# Patient Record
Sex: Male | Born: 1940 | Race: White | Hispanic: No | Smoking: Never smoker
Health system: Southern US, Community
[De-identification: ages and names within clinical notes are randomized; demographics above are authoritative.]

## PROBLEM LIST (undated history)

## (undated) DIAGNOSIS — K219 Gastro-esophageal reflux disease without esophagitis: Secondary | ICD-10-CM

## (undated) DIAGNOSIS — G8929 Other chronic pain: Secondary | ICD-10-CM

## (undated) DIAGNOSIS — M5412 Radiculopathy, cervical region: Secondary | ICD-10-CM

## (undated) DIAGNOSIS — M1990 Unspecified osteoarthritis, unspecified site: Secondary | ICD-10-CM

## (undated) DIAGNOSIS — Z951 Presence of aortocoronary bypass graft: Secondary | ICD-10-CM

## (undated) DIAGNOSIS — Z85038 Personal history of other malignant neoplasm of large intestine: Secondary | ICD-10-CM

## (undated) DIAGNOSIS — Z86718 Personal history of other venous thrombosis and embolism: Secondary | ICD-10-CM

## (undated) DIAGNOSIS — M542 Cervicalgia: Secondary | ICD-10-CM

## (undated) DIAGNOSIS — G5600 Carpal tunnel syndrome, unspecified upper limb: Secondary | ICD-10-CM

## (undated) DIAGNOSIS — F32A Depression, unspecified: Secondary | ICD-10-CM

## (undated) DIAGNOSIS — M545 Low back pain, unspecified: Secondary | ICD-10-CM

## (undated) DIAGNOSIS — M549 Dorsalgia, unspecified: Secondary | ICD-10-CM

## (undated) DIAGNOSIS — Z8505 Personal history of malignant neoplasm of liver: Secondary | ICD-10-CM

## (undated) DIAGNOSIS — M67919 Unspecified disorder of synovium and tendon, unspecified shoulder: Secondary | ICD-10-CM

## (undated) DIAGNOSIS — Z8679 Personal history of other diseases of the circulatory system: Secondary | ICD-10-CM

## (undated) DIAGNOSIS — E119 Type 2 diabetes mellitus without complications: Secondary | ICD-10-CM

## (undated) DIAGNOSIS — E785 Hyperlipidemia, unspecified: Secondary | ICD-10-CM

## (undated) DIAGNOSIS — F329 Major depressive disorder, single episode, unspecified: Secondary | ICD-10-CM

## (undated) DIAGNOSIS — I2510 Atherosclerotic heart disease of native coronary artery without angina pectoris: Secondary | ICD-10-CM

## (undated) DIAGNOSIS — Z8589 Personal history of malignant neoplasm of other organs and systems: Secondary | ICD-10-CM

## (undated) DIAGNOSIS — C7800 Secondary malignant neoplasm of unspecified lung: Secondary | ICD-10-CM

## (undated) DIAGNOSIS — I10 Essential (primary) hypertension: Secondary | ICD-10-CM

## (undated) HISTORY — PX: CORONARY ARTERY BYPASS GRAFT: SHX141

## (undated) HISTORY — PX: PORTACATH PLACEMENT: SHX2246

## (undated) HISTORY — PX: CARDIAC CATHETERIZATION: SHX172

## (undated) HISTORY — PX: CARDIOVASCULAR STRESS TEST: SHX262

## (undated) HISTORY — PX: OTHER SURGICAL HISTORY: SHX169

## (undated) HISTORY — PX: CATARACT EXTRACTION W/ INTRAOCULAR LENS  IMPLANT, BILATERAL: SHX1307

---

## 1997-08-20 ENCOUNTER — Encounter: Admission: RE | Admit: 1997-08-20 | Discharge: 1997-11-18 | Payer: Self-pay | Admitting: Family Medicine

## 1998-08-06 ENCOUNTER — Encounter: Payer: Self-pay | Admitting: Interventional Cardiology

## 1998-08-07 ENCOUNTER — Inpatient Hospital Stay (HOSPITAL_COMMUNITY): Admission: AD | Admit: 1998-08-07 | Discharge: 1998-08-13 | Payer: Self-pay | Admitting: Interventional Cardiology

## 1998-08-08 ENCOUNTER — Encounter: Payer: Self-pay | Admitting: Cardiothoracic Surgery

## 1998-08-09 ENCOUNTER — Encounter: Payer: Self-pay | Admitting: Cardiothoracic Surgery

## 1998-08-10 ENCOUNTER — Encounter: Payer: Self-pay | Admitting: Cardiothoracic Surgery

## 1998-08-12 ENCOUNTER — Encounter: Payer: Self-pay | Admitting: Cardiothoracic Surgery

## 1998-09-25 ENCOUNTER — Encounter: Admission: RE | Admit: 1998-09-25 | Discharge: 1998-12-24 | Payer: Self-pay | Admitting: Family Medicine

## 1999-01-18 ENCOUNTER — Encounter: Payer: Self-pay | Admitting: Emergency Medicine

## 1999-01-18 ENCOUNTER — Emergency Department (HOSPITAL_COMMUNITY): Admission: EM | Admit: 1999-01-18 | Discharge: 1999-01-18 | Payer: Self-pay | Admitting: *Deleted

## 1999-01-25 ENCOUNTER — Emergency Department (HOSPITAL_COMMUNITY): Admission: EM | Admit: 1999-01-25 | Discharge: 1999-01-25 | Payer: Self-pay | Admitting: Emergency Medicine

## 1999-02-03 ENCOUNTER — Encounter (INDEPENDENT_AMBULATORY_CARE_PROVIDER_SITE_OTHER): Payer: Self-pay | Admitting: *Deleted

## 1999-02-03 ENCOUNTER — Ambulatory Visit (HOSPITAL_COMMUNITY): Admission: RE | Admit: 1999-02-03 | Discharge: 1999-02-03 | Payer: Self-pay | Admitting: Gastroenterology

## 2002-07-31 ENCOUNTER — Encounter: Admission: RE | Admit: 2002-07-31 | Discharge: 2002-07-31 | Payer: Self-pay | Admitting: Family Medicine

## 2002-07-31 ENCOUNTER — Encounter: Payer: Self-pay | Admitting: Family Medicine

## 2003-07-06 ENCOUNTER — Encounter: Admission: RE | Admit: 2003-07-06 | Discharge: 2003-07-06 | Payer: Self-pay | Admitting: Family Medicine

## 2003-07-12 ENCOUNTER — Encounter: Admission: RE | Admit: 2003-07-12 | Discharge: 2003-07-12 | Payer: Self-pay | Admitting: Family Medicine

## 2004-03-16 HISTORY — PX: OTHER SURGICAL HISTORY: SHX169

## 2004-09-12 ENCOUNTER — Emergency Department (HOSPITAL_COMMUNITY): Admission: EM | Admit: 2004-09-12 | Discharge: 2004-09-12 | Payer: Self-pay | Admitting: Emergency Medicine

## 2004-09-19 ENCOUNTER — Inpatient Hospital Stay (HOSPITAL_COMMUNITY): Admission: EM | Admit: 2004-09-19 | Discharge: 2004-09-20 | Payer: Self-pay | Admitting: Emergency Medicine

## 2004-10-07 ENCOUNTER — Encounter (INDEPENDENT_AMBULATORY_CARE_PROVIDER_SITE_OTHER): Payer: Self-pay | Admitting: *Deleted

## 2004-10-07 ENCOUNTER — Ambulatory Visit (HOSPITAL_COMMUNITY): Admission: RE | Admit: 2004-10-07 | Discharge: 2004-10-07 | Payer: Self-pay | Admitting: Gastroenterology

## 2004-11-03 ENCOUNTER — Encounter (INDEPENDENT_AMBULATORY_CARE_PROVIDER_SITE_OTHER): Payer: Self-pay | Admitting: *Deleted

## 2004-11-03 ENCOUNTER — Inpatient Hospital Stay (HOSPITAL_COMMUNITY): Admission: RE | Admit: 2004-11-03 | Discharge: 2004-11-09 | Payer: Self-pay | Admitting: Surgery

## 2004-11-06 ENCOUNTER — Ambulatory Visit: Payer: Self-pay | Admitting: Hematology & Oncology

## 2004-11-12 ENCOUNTER — Ambulatory Visit: Payer: Self-pay | Admitting: Hematology & Oncology

## 2004-11-21 ENCOUNTER — Ambulatory Visit (HOSPITAL_COMMUNITY): Admission: RE | Admit: 2004-11-21 | Discharge: 2004-11-21 | Payer: Self-pay | Admitting: Hematology & Oncology

## 2004-11-24 ENCOUNTER — Ambulatory Visit (HOSPITAL_COMMUNITY): Admission: RE | Admit: 2004-11-24 | Discharge: 2004-11-24 | Payer: Self-pay | Admitting: Surgery

## 2005-01-06 ENCOUNTER — Ambulatory Visit: Payer: Self-pay | Admitting: Hematology & Oncology

## 2005-01-27 ENCOUNTER — Ambulatory Visit (HOSPITAL_COMMUNITY): Admission: RE | Admit: 2005-01-27 | Discharge: 2005-01-27 | Payer: Self-pay | Admitting: Hematology & Oncology

## 2005-03-17 ENCOUNTER — Ambulatory Visit: Payer: Self-pay | Admitting: Hematology & Oncology

## 2005-03-19 ENCOUNTER — Ambulatory Visit (HOSPITAL_COMMUNITY): Admission: RE | Admit: 2005-03-19 | Discharge: 2005-03-19 | Payer: Self-pay | Admitting: Hematology & Oncology

## 2005-03-23 ENCOUNTER — Ambulatory Visit (HOSPITAL_COMMUNITY): Admission: RE | Admit: 2005-03-23 | Discharge: 2005-03-23 | Payer: Self-pay | Admitting: Diagnostic Radiology

## 2005-04-10 ENCOUNTER — Ambulatory Visit (HOSPITAL_COMMUNITY): Admission: RE | Admit: 2005-04-10 | Discharge: 2005-04-10 | Payer: Self-pay | Admitting: Surgery

## 2005-05-05 ENCOUNTER — Ambulatory Visit (HOSPITAL_COMMUNITY): Admission: RE | Admit: 2005-05-05 | Discharge: 2005-05-05 | Payer: Self-pay | Admitting: General Surgery

## 2005-05-28 ENCOUNTER — Encounter (INDEPENDENT_AMBULATORY_CARE_PROVIDER_SITE_OTHER): Payer: Self-pay | Admitting: *Deleted

## 2005-05-29 ENCOUNTER — Inpatient Hospital Stay (HOSPITAL_COMMUNITY): Admission: EM | Admit: 2005-05-29 | Discharge: 2005-06-02 | Payer: Self-pay | Admitting: Emergency Medicine

## 2005-06-23 ENCOUNTER — Ambulatory Visit: Payer: Self-pay | Admitting: Hematology & Oncology

## 2005-06-24 LAB — CBC WITH DIFFERENTIAL/PLATELET
BASO%: 0.3 % (ref 0.0–2.0)
EOS%: 3.1 % (ref 0.0–7.0)
HCT: 38.8 % (ref 38.7–49.9)
LYMPH%: 31.4 % (ref 14.0–48.0)
MCH: 28.6 pg (ref 28.0–33.4)
MCHC: 33.6 g/dL (ref 32.0–35.9)
MCV: 85.2 fL (ref 81.6–98.0)
MONO#: 0.5 10*3/uL (ref 0.1–0.9)
MONO%: 8.2 % (ref 0.0–13.0)
NEUT%: 57 % (ref 40.0–75.0)
Platelets: 226 10*3/uL (ref 145–400)
RBC: 4.55 10*6/uL (ref 4.20–5.71)
WBC: 6.1 10*3/uL (ref 4.0–10.0)

## 2005-06-24 LAB — COMPREHENSIVE METABOLIC PANEL
ALT: 14 U/L (ref 0–40)
AST: 16 U/L (ref 0–37)
Alkaline Phosphatase: 30 U/L — ABNORMAL LOW (ref 39–117)
Creatinine, Ser: 0.8 mg/dL (ref 0.4–1.5)
Sodium: 138 mEq/L (ref 135–145)
Total Bilirubin: 0.5 mg/dL (ref 0.3–1.2)
Total Protein: 6.9 g/dL (ref 6.0–8.3)

## 2005-07-16 ENCOUNTER — Ambulatory Visit (HOSPITAL_COMMUNITY): Admission: RE | Admit: 2005-07-16 | Discharge: 2005-07-16 | Payer: Self-pay | Admitting: Hematology & Oncology

## 2005-07-22 ENCOUNTER — Ambulatory Visit (HOSPITAL_COMMUNITY): Admission: RE | Admit: 2005-07-22 | Discharge: 2005-07-22 | Payer: Self-pay | Admitting: Gastroenterology

## 2005-11-13 ENCOUNTER — Ambulatory Visit: Payer: Self-pay | Admitting: Hematology & Oncology

## 2005-11-18 LAB — COMPREHENSIVE METABOLIC PANEL
ALT: 16 U/L (ref 0–40)
AST: 14 U/L (ref 0–37)
CO2: 27 mEq/L (ref 19–32)
Sodium: 138 mEq/L (ref 135–145)
Total Bilirubin: 0.9 mg/dL (ref 0.3–1.2)
Total Protein: 7.5 g/dL (ref 6.0–8.3)

## 2005-11-18 LAB — CBC WITH DIFFERENTIAL/PLATELET
BASO%: 0.4 % (ref 0.0–2.0)
EOS%: 1.6 % (ref 0.0–7.0)
HCT: 42.7 % (ref 38.7–49.9)
MCH: 29.9 pg (ref 28.0–33.4)
MCHC: 34.5 g/dL (ref 32.0–35.9)
MONO#: 0.9 10*3/uL (ref 0.1–0.9)
NEUT%: 62.6 % (ref 40.0–75.0)
RBC: 4.93 10*6/uL (ref 4.20–5.71)
RDW: 14.3 % (ref 11.2–14.6)
WBC: 7.8 10*3/uL (ref 4.0–10.0)
lymph#: 1.9 10*3/uL (ref 0.9–3.3)

## 2005-11-18 LAB — CEA: CEA: 1.1 ng/mL (ref 0.0–5.0)

## 2005-11-19 ENCOUNTER — Ambulatory Visit (HOSPITAL_COMMUNITY): Admission: RE | Admit: 2005-11-19 | Discharge: 2005-11-19 | Payer: Self-pay | Admitting: Hematology & Oncology

## 2006-01-20 ENCOUNTER — Ambulatory Visit (HOSPITAL_COMMUNITY): Admission: RE | Admit: 2006-01-20 | Discharge: 2006-01-20 | Payer: Self-pay | Admitting: Hematology & Oncology

## 2006-03-24 ENCOUNTER — Ambulatory Visit (HOSPITAL_COMMUNITY): Admission: RE | Admit: 2006-03-24 | Discharge: 2006-03-24 | Payer: Self-pay | Admitting: Hematology & Oncology

## 2006-05-17 ENCOUNTER — Ambulatory Visit: Payer: Self-pay | Admitting: Hematology & Oncology

## 2006-07-15 ENCOUNTER — Ambulatory Visit: Payer: Self-pay | Admitting: Hematology & Oncology

## 2006-07-19 LAB — COMPREHENSIVE METABOLIC PANEL
AST: 16 U/L (ref 0–37)
Albumin: 4.2 g/dL (ref 3.5–5.2)
Alkaline Phosphatase: 44 U/L (ref 39–117)
Glucose, Bld: 199 mg/dL — ABNORMAL HIGH (ref 70–99)
Potassium: 4.7 mEq/L (ref 3.5–5.3)
Sodium: 139 mEq/L (ref 135–145)
Total Protein: 7 g/dL (ref 6.0–8.3)

## 2006-07-19 LAB — CBC WITH DIFFERENTIAL/PLATELET
BASO%: 0.3 % (ref 0.0–2.0)
Basophils Absolute: 0 10*3/uL (ref 0.0–0.1)
EOS%: 2.6 % (ref 0.0–7.0)
HCT: 42.6 % (ref 38.7–49.9)
HGB: 15.1 g/dL (ref 13.0–17.1)
LYMPH%: 29.6 % (ref 14.0–48.0)
MCH: 29.9 pg (ref 28.0–33.4)
MCHC: 35.3 g/dL (ref 32.0–35.9)
MONO#: 0.6 10*3/uL (ref 0.1–0.9)
NEUT%: 57.6 % (ref 40.0–75.0)
Platelets: 201 10*3/uL (ref 145–400)
lymph#: 1.8 10*3/uL (ref 0.9–3.3)

## 2006-07-20 ENCOUNTER — Ambulatory Visit (HOSPITAL_COMMUNITY): Admission: RE | Admit: 2006-07-20 | Discharge: 2006-07-20 | Payer: Self-pay | Admitting: Hematology & Oncology

## 2006-07-30 ENCOUNTER — Ambulatory Visit (HOSPITAL_COMMUNITY): Admission: RE | Admit: 2006-07-30 | Discharge: 2006-07-30 | Payer: Self-pay | Admitting: Hematology & Oncology

## 2006-08-05 ENCOUNTER — Encounter: Admission: RE | Admit: 2006-08-05 | Discharge: 2006-08-05 | Payer: Self-pay | Admitting: Hematology & Oncology

## 2006-09-06 ENCOUNTER — Ambulatory Visit (HOSPITAL_COMMUNITY): Admission: RE | Admit: 2006-09-06 | Discharge: 2006-09-07 | Payer: Self-pay | Admitting: Interventional Radiology

## 2006-09-10 ENCOUNTER — Ambulatory Visit (HOSPITAL_COMMUNITY): Admission: RE | Admit: 2006-09-10 | Discharge: 2006-09-10 | Payer: Self-pay | Admitting: Oncology

## 2006-10-06 ENCOUNTER — Encounter: Admission: RE | Admit: 2006-10-06 | Discharge: 2006-10-06 | Payer: Self-pay | Admitting: Interventional Radiology

## 2007-01-26 ENCOUNTER — Encounter: Admission: RE | Admit: 2007-01-26 | Discharge: 2007-01-26 | Payer: Self-pay | Admitting: Interventional Radiology

## 2007-02-04 ENCOUNTER — Encounter: Admission: RE | Admit: 2007-02-04 | Discharge: 2007-02-04 | Payer: Self-pay | Admitting: Interventional Cardiology

## 2007-02-08 ENCOUNTER — Inpatient Hospital Stay (HOSPITAL_BASED_OUTPATIENT_CLINIC_OR_DEPARTMENT_OTHER): Admission: RE | Admit: 2007-02-08 | Discharge: 2007-02-08 | Payer: Self-pay | Admitting: Interventional Cardiology

## 2007-03-31 ENCOUNTER — Encounter (INDEPENDENT_AMBULATORY_CARE_PROVIDER_SITE_OTHER): Payer: Self-pay | Admitting: Surgery

## 2007-03-31 ENCOUNTER — Inpatient Hospital Stay (HOSPITAL_COMMUNITY): Admission: RE | Admit: 2007-03-31 | Discharge: 2007-04-05 | Payer: Self-pay | Admitting: Surgery

## 2007-03-31 HISTORY — PX: OTHER SURGICAL HISTORY: SHX169

## 2007-06-23 ENCOUNTER — Encounter: Admission: RE | Admit: 2007-06-23 | Discharge: 2007-06-23 | Payer: Self-pay | Admitting: Interventional Radiology

## 2007-11-24 ENCOUNTER — Encounter: Admission: RE | Admit: 2007-11-24 | Discharge: 2007-11-24 | Payer: Self-pay | Admitting: Surgery

## 2008-01-11 ENCOUNTER — Encounter: Admission: RE | Admit: 2008-01-11 | Discharge: 2008-01-11 | Payer: Self-pay | Admitting: Interventional Radiology

## 2008-05-18 ENCOUNTER — Encounter: Admission: RE | Admit: 2008-05-18 | Discharge: 2008-05-18 | Payer: Self-pay | Admitting: Surgery

## 2008-05-29 ENCOUNTER — Ambulatory Visit (HOSPITAL_BASED_OUTPATIENT_CLINIC_OR_DEPARTMENT_OTHER): Admission: RE | Admit: 2008-05-29 | Discharge: 2008-05-29 | Payer: Self-pay | Admitting: Orthopedic Surgery

## 2008-05-29 HISTORY — PX: CARPAL TUNNEL RELEASE: SHX101

## 2008-06-01 ENCOUNTER — Ambulatory Visit (HOSPITAL_COMMUNITY): Admission: RE | Admit: 2008-06-01 | Discharge: 2008-06-01 | Payer: Self-pay | Admitting: Interventional Radiology

## 2008-06-01 ENCOUNTER — Encounter (INDEPENDENT_AMBULATORY_CARE_PROVIDER_SITE_OTHER): Payer: Self-pay | Admitting: Interventional Radiology

## 2008-06-14 ENCOUNTER — Ambulatory Visit: Payer: Self-pay | Admitting: Hematology & Oncology

## 2008-06-14 LAB — CBC WITH DIFFERENTIAL (CANCER CENTER ONLY)
BASO#: 0 10*3/uL (ref 0.0–0.2)
Eosinophils Absolute: 0.2 10*3/uL (ref 0.0–0.5)
HGB: 13.7 g/dL (ref 13.0–17.1)
LYMPH%: 29.5 % (ref 14.0–48.0)
MCH: 29.1 pg (ref 28.0–33.4)
MCV: 88 fL (ref 82–98)
MONO%: 8.3 % (ref 0.0–13.0)
RBC: 4.71 10*6/uL (ref 4.20–5.70)

## 2008-06-14 LAB — COMPREHENSIVE METABOLIC PANEL
ALT: 12 U/L (ref 0–53)
CO2: 21 mEq/L (ref 19–32)
Calcium: 9.4 mg/dL (ref 8.4–10.5)
Chloride: 104 mEq/L (ref 96–112)
Creatinine, Ser: 1.11 mg/dL (ref 0.40–1.50)
Glucose, Bld: 136 mg/dL — ABNORMAL HIGH (ref 70–99)

## 2008-06-14 LAB — CEA: CEA: 10.5 ng/mL — ABNORMAL HIGH (ref 0.0–5.0)

## 2008-06-19 ENCOUNTER — Ambulatory Visit (HOSPITAL_COMMUNITY): Admission: RE | Admit: 2008-06-19 | Discharge: 2008-06-19 | Payer: Self-pay | Admitting: Hematology & Oncology

## 2008-06-25 ENCOUNTER — Ambulatory Visit (HOSPITAL_COMMUNITY): Admission: RE | Admit: 2008-06-25 | Discharge: 2008-06-25 | Payer: Self-pay | Admitting: Hematology & Oncology

## 2008-06-26 LAB — CBC WITH DIFFERENTIAL (CANCER CENTER ONLY)
BASO%: 0.4 % (ref 0.0–2.0)
EOS%: 2.4 % (ref 0.0–7.0)
Eosinophils Absolute: 0.1 10*3/uL (ref 0.0–0.5)
LYMPH%: 28.4 % (ref 14.0–48.0)
MCH: 28.7 pg (ref 28.0–33.4)
MCHC: 32.6 g/dL (ref 32.0–35.9)
MCV: 88 fL (ref 82–98)
MONO%: 9.3 % (ref 0.0–13.0)
NEUT#: 2.9 10*3/uL (ref 1.5–6.5)
Platelets: 190 10*3/uL (ref 145–400)
RBC: 4.61 10*6/uL (ref 4.20–5.70)
RDW: 13.1 % (ref 10.5–14.6)

## 2008-06-26 LAB — CMP (CANCER CENTER ONLY)
ALT(SGPT): 16 U/L (ref 10–47)
Alkaline Phosphatase: 33 U/L (ref 26–84)
Creat: 1 mg/dl (ref 0.6–1.2)
Sodium: 139 mEq/L (ref 128–145)
Total Bilirubin: 0.6 mg/dl (ref 0.20–1.60)
Total Protein: 7.1 g/dL (ref 6.4–8.1)

## 2008-06-26 LAB — UA PROTEIN, DIPSTICK - CHCC SATELLITE: Protein, Urine: <30 mg/dL

## 2008-07-10 LAB — UA PROTEIN, DIPSTICK - CHCC SATELLITE: Protein, Urine: 30 mg/dL

## 2008-07-10 LAB — COMPREHENSIVE METABOLIC PANEL
ALT: 13 U/L (ref 0–53)
Albumin: 4.2 g/dL (ref 3.5–5.2)
BUN: 17 mg/dL (ref 6–23)
CO2: 25 mEq/L (ref 19–32)
Calcium: 9.3 mg/dL (ref 8.4–10.5)
Chloride: 105 mEq/L (ref 96–112)
Creatinine, Ser: 0.91 mg/dL (ref 0.40–1.50)
Potassium: 4.3 mEq/L (ref 3.5–5.3)

## 2008-07-10 LAB — CBC WITH DIFFERENTIAL (CANCER CENTER ONLY)
BASO%: 1.5 % (ref 0.0–2.0)
EOS%: 5 % (ref 0.0–7.0)
LYMPH%: 39 % (ref 14.0–48.0)
MCH: 30.1 pg (ref 28.0–33.4)
MCHC: 34.6 g/dL (ref 32.0–35.9)
MCV: 87 fL (ref 82–98)
MONO%: 6.3 % (ref 0.0–13.0)
Platelets: 206 10*3/uL (ref 145–400)
RDW: 12.9 % (ref 10.5–14.6)

## 2008-07-24 LAB — CBC WITH DIFFERENTIAL (CANCER CENTER ONLY)
BASO#: 0 10*3/uL (ref 0.0–0.2)
BASO%: 0.2 % (ref 0.0–2.0)
EOS%: 3.3 % (ref 0.0–7.0)
Eosinophils Absolute: 0.1 10*3/uL (ref 0.0–0.5)
HCT: 38 % — ABNORMAL LOW (ref 38.7–49.9)
HGB: 13.2 g/dL (ref 13.0–17.1)
LYMPH#: 1.2 10*3/uL (ref 0.9–3.3)
LYMPH%: 46.2 % (ref 14.0–48.0)
MCH: 30.2 pg (ref 28.0–33.4)
MCHC: 34.8 g/dL (ref 32.0–35.9)
MCV: 87 fL (ref 82–98)
MONO#: 0.2 10*3/uL (ref 0.1–0.9)
MONO%: 5.9 % (ref 0.0–13.0)
NEUT#: 1.2 10*3/uL — ABNORMAL LOW (ref 1.5–6.5)
NEUT%: 44.4 % (ref 40.0–80.0)
Platelets: 175 10*3/uL (ref 145–400)
RBC: 4.37 10*6/uL (ref 4.20–5.70)
RDW: 13 % (ref 10.5–14.6)
WBC: 2.7 10*3/uL — ABNORMAL LOW (ref 4.0–10.0)

## 2008-07-24 LAB — CEA: CEA: 4.7 ng/mL (ref 0.0–5.0)

## 2008-07-24 LAB — COMPREHENSIVE METABOLIC PANEL
ALT: 12 U/L (ref 0–53)
Albumin: 4 g/dL (ref 3.5–5.2)
CO2: 24 mEq/L (ref 19–32)
Calcium: 8.9 mg/dL (ref 8.4–10.5)
Chloride: 104 mEq/L (ref 96–112)
Glucose, Bld: 214 mg/dL — ABNORMAL HIGH (ref 70–99)
Sodium: 138 mEq/L (ref 135–145)
Total Protein: 6.7 g/dL (ref 6.0–8.3)

## 2008-07-24 LAB — UA PROTEIN, DIPSTICK - CHCC SATELLITE: Protein, Urine: 30 mg/dL

## 2008-08-06 ENCOUNTER — Ambulatory Visit: Payer: Self-pay | Admitting: Hematology & Oncology

## 2008-08-07 LAB — CBC WITH DIFFERENTIAL (CANCER CENTER ONLY)
BASO#: 0 10*3/uL (ref 0.0–0.2)
Eosinophils Absolute: 0.1 10*3/uL (ref 0.0–0.5)
HCT: 36.5 % — ABNORMAL LOW (ref 38.7–49.9)
HGB: 12.6 g/dL — ABNORMAL LOW (ref 13.0–17.1)
LYMPH#: 1.5 10*3/uL (ref 0.9–3.3)
MCH: 30.6 pg (ref 28.0–33.4)
MONO%: 8.7 % (ref 0.0–13.0)
NEUT#: 0.9 10*3/uL — ABNORMAL LOW (ref 1.5–6.5)
NEUT%: 33.3 % — ABNORMAL LOW (ref 40.0–80.0)
RBC: 4.12 10*6/uL — ABNORMAL LOW (ref 4.20–5.70)

## 2008-08-07 LAB — COMPREHENSIVE METABOLIC PANEL
ALT: 14 U/L (ref 0–53)
AST: 14 U/L (ref 0–37)
Alkaline Phosphatase: 37 U/L — ABNORMAL LOW (ref 39–117)
Creatinine, Ser: 0.88 mg/dL (ref 0.40–1.50)
Sodium: 138 mEq/L (ref 135–145)
Total Bilirubin: 0.5 mg/dL (ref 0.3–1.2)

## 2008-08-21 ENCOUNTER — Ambulatory Visit (HOSPITAL_COMMUNITY): Admission: RE | Admit: 2008-08-21 | Discharge: 2008-08-21 | Payer: Self-pay | Admitting: Hematology & Oncology

## 2008-08-28 LAB — CBC WITH DIFFERENTIAL (CANCER CENTER ONLY)
BASO%: 0.7 % (ref 0.0–2.0)
EOS%: 4 % (ref 0.0–7.0)
HCT: 38.3 % — ABNORMAL LOW (ref 38.7–49.9)
LYMPH%: 54.4 % — ABNORMAL HIGH (ref 14.0–48.0)
MCHC: 33.4 g/dL (ref 32.0–35.9)
MCV: 90 fL (ref 82–98)
MONO#: 0.7 10*3/uL (ref 0.1–0.9)
NEUT%: 24.3 % — ABNORMAL LOW (ref 40.0–80.0)
Platelets: 236 10*3/uL (ref 145–400)
RDW: 14.7 % — ABNORMAL HIGH (ref 10.5–14.6)
WBC: 4 10*3/uL (ref 4.0–10.0)

## 2008-08-28 LAB — COMPREHENSIVE METABOLIC PANEL
ALT: 13 U/L (ref 0–53)
AST: 12 U/L (ref 0–37)
Alkaline Phosphatase: 31 U/L — ABNORMAL LOW (ref 39–117)
BUN: 20 mg/dL (ref 6–23)
Calcium: 9 mg/dL (ref 8.4–10.5)
Creatinine, Ser: 1.25 mg/dL (ref 0.40–1.50)
Total Bilirubin: 0.5 mg/dL (ref 0.3–1.2)

## 2008-08-28 LAB — CEA: CEA: 1.8 ng/mL (ref 0.0–5.0)

## 2008-09-03 LAB — CBC WITH DIFFERENTIAL (CANCER CENTER ONLY)
BASO#: 0 10*3/uL (ref 0.0–0.2)
EOS%: 2.3 % (ref 0.0–7.0)
HCT: 37.9 % — ABNORMAL LOW (ref 38.7–49.9)
HGB: 12.7 g/dL — ABNORMAL LOW (ref 13.0–17.1)
LYMPH#: 1.6 10*3/uL (ref 0.9–3.3)
MCHC: 33.7 g/dL (ref 32.0–35.9)
MONO#: 0.5 10*3/uL (ref 0.1–0.9)
NEUT#: 3 10*3/uL (ref 1.5–6.5)
RBC: 4.18 10*6/uL — ABNORMAL LOW (ref 4.20–5.70)
WBC: 5.2 10*3/uL (ref 4.0–10.0)

## 2008-09-03 LAB — CMP (CANCER CENTER ONLY)
ALT(SGPT): 15 U/L (ref 10–47)
CO2: 25 mEq/L (ref 18–33)
Calcium: 9.4 mg/dL (ref 8.0–10.3)
Chloride: 101 mEq/L (ref 98–108)
Creat: 0.8 mg/dl (ref 0.6–1.2)
Glucose, Bld: 316 mg/dL — ABNORMAL HIGH (ref 73–118)
Total Bilirubin: 0.5 mg/dl (ref 0.20–1.60)

## 2008-09-03 LAB — UA PROTEIN, DIPSTICK - CHCC: Protein, Urine: <30 mg/dL

## 2008-09-18 ENCOUNTER — Ambulatory Visit: Payer: Self-pay | Admitting: Hematology & Oncology

## 2008-09-18 LAB — CBC WITH DIFFERENTIAL (CANCER CENTER ONLY)
BASO%: 2 % (ref 0.0–2.0)
LYMPH#: 1.8 10*3/uL (ref 0.9–3.3)
LYMPH%: 40 % (ref 14.0–48.0)
MCV: 91 fL (ref 82–98)
MONO#: 0.4 10*3/uL (ref 0.1–0.9)
Platelets: 176 10*3/uL (ref 145–400)
RDW: 16.2 % — ABNORMAL HIGH (ref 10.5–14.6)
WBC: 4.6 10*3/uL (ref 4.0–10.0)

## 2008-10-01 LAB — MANUAL DIFFERENTIAL (CHCC SATELLITE)
ALC: 1.1 10*3/uL (ref 0.9–3.3)
Eos: 2 % (ref 0–7)
MONO: 6 % (ref 0–13)
PLT EST ~~LOC~~: ADEQUATE
Platelet Morphology: NORMAL

## 2008-10-01 LAB — CMP (CANCER CENTER ONLY)
ALT(SGPT): 22 U/L (ref 10–47)
AST: 21 U/L (ref 11–38)
Albumin: 3.4 g/dL (ref 3.3–5.5)
CO2: 24 mEq/L (ref 18–33)
Calcium: 9.3 mg/dL (ref 8.0–10.3)
Chloride: 96 mEq/L — ABNORMAL LOW (ref 98–108)
Creat: 0.8 mg/dl (ref 0.6–1.2)
Potassium: 4.9 mEq/L — ABNORMAL HIGH (ref 3.3–4.7)
Total Protein: 6.6 g/dL (ref 6.4–8.1)

## 2008-10-01 LAB — CBC WITH DIFFERENTIAL (CANCER CENTER ONLY)
HCT: 38.3 % — ABNORMAL LOW (ref 38.7–49.9)
RDW: 14.8 % — ABNORMAL HIGH (ref 10.5–14.6)
WBC: 2.7 10*3/uL — ABNORMAL LOW (ref 4.0–10.0)

## 2008-10-15 LAB — UA PROTEIN, DIPSTICK - CHCC SATELLITE: Protein, Urine: <30 mg/dL

## 2008-10-15 LAB — CBC WITH DIFFERENTIAL (CANCER CENTER ONLY)
BASO%: 1 % (ref 0.0–2.0)
Eosinophils Absolute: 0.1 10*3/uL (ref 0.0–0.5)
HCT: 33.5 % — ABNORMAL LOW (ref 38.7–49.9)
LYMPH%: 36.3 % (ref 14.0–48.0)
MCH: 30.6 pg (ref 28.0–33.4)
MCHC: 33.5 g/dL (ref 32.0–35.9)
MCV: 91 fL (ref 82–98)
MONO#: 0.5 10*3/uL (ref 0.1–0.9)
MONO%: 10.4 % (ref 0.0–13.0)
NEUT#: 2.2 10*3/uL (ref 1.5–6.5)
NEUT%: 49.6 % (ref 40.0–80.0)
Platelets: 257 10*3/uL (ref 145–400)
RBC: 3.67 10*6/uL — ABNORMAL LOW (ref 4.20–5.70)
RDW: 13.4 % (ref 10.5–14.6)
WBC: 4.4 10*3/uL (ref 4.0–10.0)

## 2008-10-15 LAB — COMPREHENSIVE METABOLIC PANEL
ALT: 11 U/L (ref 0–53)
AST: 10 U/L (ref 0–37)
Albumin: 3.7 g/dL (ref 3.5–5.2)
CO2: 22 mEq/L (ref 19–32)
Calcium: 9.2 mg/dL (ref 8.4–10.5)
Chloride: 102 mEq/L (ref 96–112)
Potassium: 4.9 mEq/L (ref 3.5–5.3)
Total Protein: 6.6 g/dL (ref 6.0–8.3)

## 2008-10-30 ENCOUNTER — Ambulatory Visit (HOSPITAL_COMMUNITY): Admission: RE | Admit: 2008-10-30 | Discharge: 2008-10-30 | Payer: Self-pay | Admitting: Hematology & Oncology

## 2008-11-02 ENCOUNTER — Ambulatory Visit: Payer: Self-pay | Admitting: Hematology & Oncology

## 2008-12-26 ENCOUNTER — Ambulatory Visit: Payer: Self-pay | Admitting: Hematology & Oncology

## 2009-07-10 ENCOUNTER — Ambulatory Visit: Payer: Self-pay | Admitting: Hematology & Oncology

## 2009-07-11 LAB — CBC WITH DIFFERENTIAL (CANCER CENTER ONLY)
BASO#: 0 10*3/uL (ref 0.0–0.2)
BASO%: 0.4 % (ref 0.0–2.0)
EOS%: 3.1 % (ref 0.0–7.0)
HGB: 12.8 g/dL — ABNORMAL LOW (ref 13.0–17.1)
LYMPH#: 1.9 10*3/uL (ref 0.9–3.3)
MCH: 28.1 pg (ref 28.0–33.4)
MCHC: 33.5 g/dL (ref 32.0–35.9)
MONO%: 6.4 % (ref 0.0–13.0)
NEUT#: 3.1 10*3/uL (ref 1.5–6.5)
RDW: 15 % — ABNORMAL HIGH (ref 10.5–14.6)

## 2009-07-11 LAB — COMPREHENSIVE METABOLIC PANEL
ALT: 13 U/L (ref 0–53)
AST: 14 U/L (ref 0–37)
Albumin: 4 g/dL (ref 3.5–5.2)
Alkaline Phosphatase: 53 U/L (ref 39–117)
BUN: 24 mg/dL — ABNORMAL HIGH (ref 6–23)
Chloride: 103 mEq/L (ref 96–112)
Creatinine, Ser: 0.94 mg/dL (ref 0.40–1.50)
Potassium: 4.7 mEq/L (ref 3.5–5.3)

## 2009-07-31 LAB — COMPREHENSIVE METABOLIC PANEL
ALT: 11 U/L (ref 0–53)
AST: 15 U/L (ref 0–37)
Albumin: 3.9 g/dL (ref 3.5–5.2)
Alkaline Phosphatase: 58 U/L (ref 39–117)
Calcium: 8.7 mg/dL (ref 8.4–10.5)
Chloride: 103 mEq/L (ref 96–112)
Potassium: 4.6 mEq/L (ref 3.5–5.3)
Sodium: 134 mEq/L — ABNORMAL LOW (ref 135–145)
Total Protein: 6.4 g/dL (ref 6.0–8.3)

## 2009-07-31 LAB — CBC WITH DIFFERENTIAL (CANCER CENTER ONLY)
BASO#: 0.1 10*3/uL (ref 0.0–0.2)
BASO%: 0.8 % (ref 0.0–2.0)
EOS%: 4.8 % (ref 0.0–7.0)
HGB: 12.9 g/dL — ABNORMAL LOW (ref 13.0–17.1)
LYMPH#: 1.9 10*3/uL (ref 0.9–3.3)
MCH: 28.3 pg (ref 28.0–33.4)
MCHC: 33.5 g/dL (ref 32.0–35.9)
MONO%: 7 % (ref 0.0–13.0)
NEUT#: 3.6 10*3/uL (ref 1.5–6.5)
Platelets: 167 10*3/uL (ref 145–400)

## 2009-08-13 ENCOUNTER — Ambulatory Visit: Payer: Self-pay | Admitting: Hematology & Oncology

## 2009-08-14 LAB — CBC WITH DIFFERENTIAL (CANCER CENTER ONLY)
BASO%: 0.7 % (ref 0.0–2.0)
HCT: 39.5 % (ref 38.7–49.9)
HGB: 13.5 g/dL (ref 13.0–17.1)
LYMPH#: 1.7 10*3/uL (ref 0.9–3.3)
MONO#: 0.4 10*3/uL (ref 0.1–0.9)
NEUT%: 48.2 % (ref 40.0–80.0)
RDW: 15.2 % — ABNORMAL HIGH (ref 10.5–14.6)
WBC: 4.6 10*3/uL (ref 4.0–10.0)

## 2009-08-28 LAB — CBC WITH DIFFERENTIAL (CANCER CENTER ONLY)
BASO#: 0 10*3/uL (ref 0.0–0.2)
Eosinophils Absolute: 0.2 10*3/uL (ref 0.0–0.5)
HCT: 37.1 % — ABNORMAL LOW (ref 38.7–49.9)
HGB: 12.6 g/dL — ABNORMAL LOW (ref 13.0–17.1)
LYMPH#: 1.5 10*3/uL (ref 0.9–3.3)
MCHC: 34 g/dL (ref 32.0–35.9)
MONO#: 0.3 10*3/uL (ref 0.1–0.9)
NEUT%: 53 % (ref 40.0–80.0)
RBC: 4.3 10*6/uL (ref 4.20–5.70)

## 2009-08-28 LAB — COMPREHENSIVE METABOLIC PANEL
Albumin: 3.9 g/dL (ref 3.5–5.2)
BUN: 24 mg/dL — ABNORMAL HIGH (ref 6–23)
CO2: 20 mEq/L (ref 19–32)
Calcium: 8.4 mg/dL (ref 8.4–10.5)
Chloride: 105 mEq/L (ref 96–112)
Creatinine, Ser: 0.91 mg/dL (ref 0.40–1.50)
Glucose, Bld: 244 mg/dL — ABNORMAL HIGH (ref 70–99)

## 2009-09-11 LAB — COMPREHENSIVE METABOLIC PANEL
ALT: 15 U/L (ref 0–53)
AST: 13 U/L (ref 0–37)
Albumin: 4.1 g/dL (ref 3.5–5.2)
BUN: 27 mg/dL — ABNORMAL HIGH (ref 6–23)
CO2: 22 mEq/L (ref 19–32)
Calcium: 9.2 mg/dL (ref 8.4–10.5)
Chloride: 103 mEq/L (ref 96–112)
Creatinine, Ser: 1.09 mg/dL (ref 0.40–1.50)
Potassium: 4.9 mEq/L (ref 3.5–5.3)

## 2009-09-11 LAB — CBC WITH DIFFERENTIAL (CANCER CENTER ONLY)
BASO#: 0.1 10*3/uL (ref 0.0–0.2)
BASO%: 0.8 % (ref 0.0–2.0)
HCT: 37.9 % — ABNORMAL LOW (ref 38.7–49.9)
HGB: 13.1 g/dL (ref 13.0–17.1)
LYMPH#: 1.7 10*3/uL (ref 0.9–3.3)
MONO#: 0.5 10*3/uL (ref 0.1–0.9)
NEUT#: 3.6 10*3/uL (ref 1.5–6.5)
NEUT%: 59.2 % (ref 40.0–80.0)
WBC: 6.1 10*3/uL (ref 4.0–10.0)

## 2009-09-11 LAB — CEA: CEA: 1 ng/mL (ref 0.0–5.0)

## 2009-09-12 ENCOUNTER — Ambulatory Visit: Payer: Self-pay | Admitting: Hematology & Oncology

## 2009-09-25 LAB — COMPREHENSIVE METABOLIC PANEL
BUN: 11 mg/dL (ref 6–23)
CO2: 23 mEq/L (ref 19–32)
Calcium: 8.8 mg/dL (ref 8.4–10.5)
Chloride: 107 mEq/L (ref 96–112)
Creatinine, Ser: 0.97 mg/dL (ref 0.40–1.50)

## 2009-09-25 LAB — CBC WITH DIFFERENTIAL (CANCER CENTER ONLY)
BASO#: 0 10*3/uL (ref 0.0–0.2)
EOS%: 4 % (ref 0.0–7.0)
HCT: 35.9 % — ABNORMAL LOW (ref 38.7–49.9)
HGB: 12.5 g/dL — ABNORMAL LOW (ref 13.0–17.1)
LYMPH#: 1.7 10*3/uL (ref 0.9–3.3)
LYMPH%: 41.4 % (ref 14.0–48.0)
MCHC: 34.7 g/dL (ref 32.0–35.9)
MCV: 88 fL (ref 82–98)
MONO#: 0.4 10*3/uL (ref 0.1–0.9)
NEUT%: 44.5 % (ref 40.0–80.0)

## 2009-10-09 LAB — CBC WITH DIFFERENTIAL (CANCER CENTER ONLY)
BASO#: 0 10*3/uL (ref 0.0–0.2)
Eosinophils Absolute: 0.1 10*3/uL (ref 0.0–0.5)
HCT: 37.9 % — ABNORMAL LOW (ref 38.7–49.9)
HGB: 12.9 g/dL — ABNORMAL LOW (ref 13.0–17.1)
LYMPH#: 1.7 10*3/uL (ref 0.9–3.3)
MONO#: 0.5 10*3/uL (ref 0.1–0.9)
NEUT%: 52.4 % (ref 40.0–80.0)
WBC: 4.8 10*3/uL (ref 4.0–10.0)

## 2009-10-09 LAB — COMPREHENSIVE METABOLIC PANEL
ALT: 19 U/L (ref 0–53)
BUN: 27 mg/dL — ABNORMAL HIGH (ref 6–23)
CO2: 19 mEq/L (ref 19–32)
Calcium: 9 mg/dL (ref 8.4–10.5)
Chloride: 106 mEq/L (ref 96–112)
Creatinine, Ser: 1.1 mg/dL (ref 0.40–1.50)

## 2009-10-22 ENCOUNTER — Ambulatory Visit: Payer: Self-pay | Admitting: Hematology & Oncology

## 2009-10-23 LAB — CBC WITH DIFFERENTIAL (CANCER CENTER ONLY)
BASO%: 0.4 % (ref 0.0–2.0)
EOS%: 2.6 % (ref 0.0–7.0)
HCT: 37.6 % — ABNORMAL LOW (ref 38.7–49.9)
LYMPH#: 1.4 10*3/uL (ref 0.9–3.3)
LYMPH%: 31 % (ref 14.0–48.0)
MCHC: 34.2 g/dL (ref 32.0–35.9)
MCV: 91 fL (ref 82–98)
MONO#: 0.5 10*3/uL (ref 0.1–0.9)
NEUT%: 54.7 % (ref 40.0–80.0)
Platelets: 154 10*3/uL (ref 145–400)
RDW: 15 % — ABNORMAL HIGH (ref 10.5–14.6)
WBC: 4.6 10*3/uL (ref 4.0–10.0)

## 2009-10-23 LAB — COMPREHENSIVE METABOLIC PANEL
ALT: 22 U/L (ref 0–53)
AST: 20 U/L (ref 0–37)
Creatinine, Ser: 1.04 mg/dL (ref 0.40–1.50)
Sodium: 137 mEq/L (ref 135–145)
Total Bilirubin: 0.5 mg/dL (ref 0.3–1.2)
Total Protein: 6.8 g/dL (ref 6.0–8.3)

## 2009-11-13 ENCOUNTER — Ambulatory Visit: Payer: Self-pay | Admitting: Diagnostic Radiology

## 2009-11-13 ENCOUNTER — Ambulatory Visit (HOSPITAL_BASED_OUTPATIENT_CLINIC_OR_DEPARTMENT_OTHER)
Admission: RE | Admit: 2009-11-13 | Discharge: 2009-11-13 | Payer: Self-pay | Source: Home / Self Care | Admitting: Hematology & Oncology

## 2009-11-13 LAB — CBC WITH DIFFERENTIAL (CANCER CENTER ONLY)
BASO#: 0 10*3/uL (ref 0.0–0.2)
EOS%: 3.5 % (ref 0.0–7.0)
HCT: 41.5 % (ref 38.7–49.9)
HGB: 13.5 g/dL (ref 13.0–17.1)
LYMPH%: 45.6 % (ref 14.0–48.0)
MCH: 30.4 pg (ref 28.0–33.4)
MCHC: 32.4 g/dL (ref 32.0–35.9)
MCV: 94 fL (ref 82–98)
MONO%: 16.9 % — ABNORMAL HIGH (ref 0.0–13.0)
NEUT%: 33.4 % — ABNORMAL LOW (ref 40.0–80.0)
RDW: 14.3 % (ref 10.5–14.6)

## 2009-11-13 LAB — COMPREHENSIVE METABOLIC PANEL
AST: 20 U/L (ref 0–37)
Alkaline Phosphatase: 48 U/L (ref 39–117)
BUN: 17 mg/dL (ref 6–23)
Calcium: 9.9 mg/dL (ref 8.4–10.5)
Chloride: 102 mEq/L (ref 96–112)
Creatinine, Ser: 1.15 mg/dL (ref 0.40–1.50)
Total Bilirubin: 0.5 mg/dL (ref 0.3–1.2)

## 2009-11-19 LAB — BASIC METABOLIC PANEL
BUN: 15 mg/dL (ref 6–23)
CO2: 26 mEq/L (ref 19–32)
Calcium: 8.6 mg/dL (ref 8.4–10.5)
Glucose, Bld: 273 mg/dL — ABNORMAL HIGH (ref 70–99)
Sodium: 136 mEq/L (ref 135–145)

## 2009-12-10 ENCOUNTER — Ambulatory Visit: Payer: Self-pay | Admitting: Hematology & Oncology

## 2010-06-16 ENCOUNTER — Other Ambulatory Visit: Payer: Self-pay | Admitting: Orthopedic Surgery

## 2010-06-16 ENCOUNTER — Other Ambulatory Visit (HOSPITAL_COMMUNITY): Payer: Self-pay | Admitting: Orthopedic Surgery

## 2010-06-16 ENCOUNTER — Ambulatory Visit (HOSPITAL_COMMUNITY)
Admission: RE | Admit: 2010-06-16 | Discharge: 2010-06-16 | Disposition: A | Discharge status: Home / Self Care | Payer: Medicare Other | Source: Ambulatory Visit | Attending: Orthopedic Surgery | Admitting: Orthopedic Surgery

## 2010-06-16 ENCOUNTER — Encounter (HOSPITAL_COMMUNITY): Payer: Medicare Other

## 2010-06-16 DIAGNOSIS — Z01810 Encounter for preprocedural cardiovascular examination: Secondary | ICD-10-CM | POA: Insufficient documentation

## 2010-06-16 DIAGNOSIS — M75100 Unspecified rotator cuff tear or rupture of unspecified shoulder, not specified as traumatic: Secondary | ICD-10-CM

## 2010-06-16 DIAGNOSIS — Z01818 Encounter for other preprocedural examination: Secondary | ICD-10-CM | POA: Insufficient documentation

## 2010-06-16 DIAGNOSIS — M67919 Unspecified disorder of synovium and tendon, unspecified shoulder: Secondary | ICD-10-CM | POA: Insufficient documentation

## 2010-06-16 DIAGNOSIS — Z01812 Encounter for preprocedural laboratory examination: Secondary | ICD-10-CM | POA: Insufficient documentation

## 2010-06-16 DIAGNOSIS — M719 Bursopathy, unspecified: Secondary | ICD-10-CM | POA: Insufficient documentation

## 2010-06-16 LAB — COMPREHENSIVE METABOLIC PANEL
BUN: 12 mg/dL (ref 6–23)
CO2: 27 mEq/L (ref 19–32)
Calcium: 9.1 mg/dL (ref 8.4–10.5)
Chloride: 107 mEq/L (ref 96–112)
Creatinine, Ser: 0.86 mg/dL (ref 0.4–1.5)
GFR calc non Af Amer: >60 mL/min (ref >60)
Total Bilirubin: 0.7 mg/dL (ref 0.3–1.2)

## 2010-06-16 LAB — SURGICAL PCR SCREEN
MRSA, PCR: NEGATIVE
Staphylococcus aureus: NEGATIVE

## 2010-06-16 LAB — CBC
Hemoglobin: 13.6 g/dL (ref 13.0–17.0)
MCH: 28 pg (ref 26.0–34.0)
MCHC: 33.3 g/dL (ref 30.0–36.0)
MCV: 84.3 fL (ref 78.0–100.0)
RBC: 4.85 MIL/uL (ref 4.22–5.81)

## 2010-06-21 LAB — GLUCOSE, CAPILLARY: Glucose-Capillary: 174 mg/dL — ABNORMAL HIGH (ref 70–99)

## 2010-06-25 LAB — PROTIME-INR: Prothrombin Time: 13.8 seconds (ref 11.6–15.2)

## 2010-06-26 ENCOUNTER — Ambulatory Visit (HOSPITAL_COMMUNITY)
Admission: RE | Admit: 2010-06-26 | Discharge: 2010-06-26 | Disposition: A | Discharge status: Home / Self Care | Payer: Medicare Other | Source: Ambulatory Visit | Attending: Orthopedic Surgery | Admitting: Orthopedic Surgery

## 2010-06-26 DIAGNOSIS — I2510 Atherosclerotic heart disease of native coronary artery without angina pectoris: Secondary | ICD-10-CM | POA: Insufficient documentation

## 2010-06-26 DIAGNOSIS — M19019 Primary osteoarthritis, unspecified shoulder: Secondary | ICD-10-CM | POA: Insufficient documentation

## 2010-06-26 DIAGNOSIS — Z01812 Encounter for preprocedural laboratory examination: Secondary | ICD-10-CM | POA: Insufficient documentation

## 2010-06-26 DIAGNOSIS — M67919 Unspecified disorder of synovium and tendon, unspecified shoulder: Secondary | ICD-10-CM | POA: Insufficient documentation

## 2010-06-26 DIAGNOSIS — I10 Essential (primary) hypertension: Secondary | ICD-10-CM | POA: Insufficient documentation

## 2010-06-26 DIAGNOSIS — M25819 Other specified joint disorders, unspecified shoulder: Secondary | ICD-10-CM | POA: Insufficient documentation

## 2010-06-26 DIAGNOSIS — E119 Type 2 diabetes mellitus without complications: Secondary | ICD-10-CM | POA: Insufficient documentation

## 2010-06-26 DIAGNOSIS — M719 Bursopathy, unspecified: Secondary | ICD-10-CM | POA: Insufficient documentation

## 2010-06-26 DIAGNOSIS — Z951 Presence of aortocoronary bypass graft: Secondary | ICD-10-CM | POA: Insufficient documentation

## 2010-06-26 HISTORY — PX: OTHER SURGICAL HISTORY: SHX169

## 2010-06-26 LAB — GLUCOSE, CAPILLARY
Glucose-Capillary: 198 mg/dL — ABNORMAL HIGH (ref 70–99)
Glucose-Capillary: 216 mg/dL — ABNORMAL HIGH (ref 70–99)
Glucose-Capillary: 339 mg/dL — ABNORMAL HIGH (ref 70–99)
Glucose-Capillary: 195 mg/dL — ABNORMAL HIGH (ref 70–99)

## 2010-06-26 LAB — CBC
HCT: 39 % (ref 39.0–52.0)
Hemoglobin: 13.1 g/dL (ref 13.0–17.0)
Platelets: 182 10*3/uL (ref 150–400)
RDW: 13.9 % (ref 11.5–15.5)
WBC: 4.9 10*3/uL (ref 4.0–10.5)

## 2010-06-26 LAB — PROTIME-INR: INR: 1.1 (ref 0.00–1.49)

## 2010-06-26 LAB — BASIC METABOLIC PANEL
Calcium: 9.2 mg/dL (ref 8.4–10.5)
GFR calc Af Amer: >60 mL/min (ref >60)
GFR calc non Af Amer: >60 mL/min (ref >60)
Potassium: 5.3 mEq/L — ABNORMAL HIGH (ref 3.5–5.1)
Sodium: 136 mEq/L (ref 135–145)

## 2010-06-26 LAB — POCT HEMOGLOBIN-HEMACUE: Hemoglobin: 14.3 g/dL (ref 13.0–17.0)

## 2010-07-03 NOTE — Op Note (Signed)
NAMEJAMESROBERT, OHANESIAN              ACCOUNT NO.:  [id number]  MEDICAL RECORD NO.:  [id number]           PATIENT TYPE:  O  LOCATION:  DAYL                         FACILITY:  [facility]  PHYSICIAN:  Jones Broom, MD    DATE OF BIRTH:  03-14-1941  DATE OF PROCEDURE:  06/26/2010 DATE OF DISCHARGE:  06/26/2010                              OPERATIVE REPORT   PREOPERATIVE DIAGNOSES: 1. Right shoulder rotator cuff tear. 2. Right shoulder impingement. 3. Right shoulder acromioclavicular joint degenerative joint disease     with significant cyst formation superiorly.  POSTOPERATIVE DIAGNOSES: 1. Right shoulder rotator cuff tear. 2. Right shoulder impingement. 3. Right shoulder acromioclavicular joint degenerative joint disease     with significant cyst formation superiorly.  PROCEDURE PERFORMED: 1. Right shoulder arthroscopic rotator cuff repair. 2. Right shoulder arthroscopic acromioplasty. 3. Right shoulder open distal clavicle excision.  ATTENDING SURGEON:  Jones Broom, MD  ASSISTANT:  Laural Benes. Jannet Mantis.  ANESTHESIA:  GETA with preoperative interscalene block.  COMPLICATIONS:  None.  DRAINS:  None.  SPECIMENS:  None.  ESTIMATED BLOOD LOSS:  Minimal.  INDICATIONS FOR SURGERY:  The patient is a 70 year old gentleman who has a long history right shoulder pain.  He has significant AC joint degenerative disease with the superior cyst formation which has been symptomatic.  He also was found to have rotator cuff tear which was felt to be symptomatic.  He elected to go forward with operative treatment understanding risks, benefits and alternatives to surgery including but not limited to risk of bleeding, infection, damage to neurovascular structures, risk of stiffness and risk of nonhealing of the cuff pr re- tear.  He understood all this and elected to go forward with surgery.  OPERATIVE FINDINGS:  Examination under anesthesia demonstrated no significant stiffness or  instability.  Diagnostic arthroscopy revealed some diffuse synovitis throughout the joint.  The biceps tendon was absent from the superior labrum.  There was significant fraying of the superior labrum which was debrided.  The synovitis in the joint was debrided.  The upper border of the subscapularis had some partial tearing that was not felt to be necessary for repair.  The humeral head had a small area of chondromalacia anteriorly but the remainder of the humeral head and glenoid were pristine.  No loose bodies were noted.  He was noted to have a large supraspinatus tear involving the entire supraspinatus.  It was retracted to the mid humeral head.  After extensive preparation and debridement releases, the tendon was able to be brought over to the tuberosity without significant undue tension. Therefore, it was felt the repair could be carried out.  A repair was carried out with two 5.5-mm BioComposite Corkscrew anchors in a simple repair.  A subacromial decompression was performed.  Given the extent of superior cyst formation at the [facility] joint, an open distal clavicle resection was then performed taking approximately 15 mm the distal clavicle.  DESCRIPTION OF PROCEDURE:  The patient was identified in preop holding area where I personally marked the operative site after verifying site side and procedure with the patient.  He had an interscalene block given by  the attending anesthesiologist and was then taken back to the operating room where general anesthesia was induced without complication.  He was placed in the beach-chair position with all extremities carefully padded in position.  He did receive IV antibiotics prior to commencement of procedure.  The right upper extremity was then prepped and draped in a standard sterile fashion.  Appropriate time-out procedure was carried out.  A standard posterior portal was established and arthroscope was introduced into the joint.  Diagnostic  arthroscopy was carried out with findings as described above.  The anterior portal was established above the subscapularis and small cannula was placed here.  The shaver was introduced to debride the synovitis and the degenerative tearing of the superior labrum.  The undersurface of the rotator cuff was debrided from this position as well as well as a capsular release superiorly beneath the rotator cuff to try and mobilize the cuff tendon.  This was carried out with [employer].  The arthroscope was then introduced in the subacromial space and standard lateral portal was established with needle localization. Collene Mares was introduced through the lateral portal and significant debridement was carried out.  There was 1 small strand of tissue which was felt to be either biceps tendon which was scarred into the tuberosity or a small strand of unhealthy appearing supraspinatus which was debrided off the tuberosity and debrided away.  The tuberosity was exposed and prepared.  The tendon was then mobilized sequentially.  A posterolateral portal was made for viewing and a large yellow cannula was placed in the lateral portal.  A suture passer was used to pass a FiberWire which was then brought out of accessory anterolateral portal to place traction on the tendon to allow further mobilization. Extensive subacromial releases were carried out to the posterior scapular spine and again the undersurface was released with capsular release.  After all these releases, I was able to bring the tendon over to the tuberosity without significant undue tension.  I also medialized articular margin with a bur a few millimeters to try and promote healing.  The tendon was then repaired with two 5.5-mm BioComposite Corkscrew anchors placing sutures in a simple fashion starting posterior progressing anterior.  The sutures were then tied from posterior to anterior while holding the tendon reduced with the traction stitch.   The repair was felt to be excellent with fairly good cover to the tuberosity and no significant undue tension on the repair.  It was viewed from posterior lateral portals.  A standard acromioplasty was then carried out, flattening the undersurface of the anterior acromion.  He was noted to have a moderate anterior acromial spur.  The scope was then removed from the joint and attention was then turned to the [facility] joint.  An approximately 4-cm incision was made in Langer's lines over the [facility] joint and dissection was carried down creating subcutaneous flaps medially and laterally.  The fascia overlying the AC joint was split longitudinally exposing several large cysts which had a yellowish cystic fluid which was gelatinous.  These were all excised.  AC joint was then exposed and baby Hohmann retractors were placed anterior and posterior and a sagittal saw was used to take off 15 mm of the distal clavicle which was excised and discarded.  The resultant space was then manually palpated and felt to be adequate to prevent any impingement at the [facility] joint.  The wound was then copiously irrigated with normal saline and the fascia was closed over the [facility] joint with  interrupted 0 Vicryl sutures in a figure- of-eight fashion.  Skin was then closed with 2-0 Vicryl in a deep dermal layer and 4-0 Monocryl for skin closure.  The portals were also closed with 4-0 Monocryl.  Steri-Strips were applied and sterile dressings were applied including Xeroform, 4 x 4s, ABDs and tape.  The patient was allowed to awaken from general anesthesia, transferred to stretcher and taken to the recovery room in stable condition.  POSTOPERATIVE PLAN:  Assuming he does well in the recovery room, he will be discharged home in stable condition with family.  If he needs he can be observed overnight tonight.  He will follow up in 1 week for suture removal and wound check.  Meantime, he will remain in a sling.     Jones Broom,  MD     JC/MEDQ  D:  06/26/2010  T:  06/26/2010  Job:  [id number]  Electronically Signed by Jones Broom  on 07/03/2010 03:02:41 PM

## 2010-07-29 NOTE — Op Note (Signed)
NAMEEDGAR, REISZ              ACCOUNT NO.:  [id number]   MEDICAL RECORD NO.:  [id number]          PATIENT TYPE:  AMB   LOCATION:  DSC                          FACILITY:  [facility]   PHYSICIAN:  Cindee Salt, M.D.       DATE OF BIRTH:  1941/01/28   DATE OF PROCEDURE:  05/29/2008  DATE OF DISCHARGE:                               OPERATIVE REPORT   PREOPERATIVE DIAGNOSIS:  Carpal tunnel syndrome, right hand.   POSTOPERATIVE DIAGNOSIS:  Carpal tunnel syndrome, right hand.   OPERATION:  Decompression, right median nerve.   SURGEON:  Cindee Salt, MD   ASSISTANT:  Joaquin Courts, RN   ANESTHESIA:  Forearm based IV regional.   ANESTHESIOLOGIST:  W. Autumn Patty, MD   HISTORY:  The patient is a 70 year old male with a history of carpal  tunnel syndrome, EMG nerve conductions positive.  This has not responded  to conservative treatment.  He has elected to undergo surgical  decompression.  In the preoperative area, the patient is seen.  The  extremity marked by both the patient and surgeon.  He is aware of risks  and complications including infection, recurrence, injury to arteries,  nerves, tendons, incomplete relief of symptoms, and dystrophy.  Antibiotic is given in the preoperative area.   PROCEDURE IN DETAIL:  The patient was brought to the operating room  where a forearm based IV regional anesthetic was carried out without  difficulty.  He was prepped using DuraPrep, supine position, right arm  free.  A time-out was taken confirming the patient and the procedure.  A  longitudinal incision was made in the palm and carried down through  subcutaneous tissue.  He had some feeling.  The area was infiltrated  with 0.25% Marcaine without epinephrine.  The dissection was deepened to  the palmar fascia.  This was split.  The incision made into the  transverse retinaculum allowing visualization of the ulnar aspect of  median nerve.  Retractors were placed.  The dissection was carried  distally and proximally after placing the retractors to protect the  median nerve.  A release was performed both proximally and distally  through the transverse retinaculum.  The distal forearm fascia was also  released for approximately a centimeter and half proximal to the wrist  crease after placement of right angle and Sewall retractors.  The wound  was opened.  The nerve was identified.  A large persistent median artery  and vena comitans was present.  This was not thrombosed and as such it  was not resected.  The wound was copiously irrigated with saline.  The  area of compression to the nerve was otherwise apparent along with the  artery.  The skin was then closed with interrupted 5-0 Vicryl Rapide  sutures.  A sterile compressive dressing and splint to the wrist  applied.  Fingers were left free.  On deflation of the tourniquet, all fingers  immediately pinked.  He was taken to the recovery room for observation  in satisfactory condition.  He will be discharged home to return to the  [facility] in  1 week on Vicodin.           ______________________________  Cindee Salt, M.D.     GK/MEDQ  D:  05/29/2008  T:  05/29/2008  Job:  [id number]   cc:   Charlesetta Shanks

## 2010-07-29 NOTE — Discharge Summary (Signed)
NAMEJAMANI, [name]              ACCOUNT NO.:  [id number]   MEDICAL RECORD NO.:  [id number]          PATIENT TYPE:  INP   LOCATION:  1530                         FACILITY:  [facility]   PHYSICIAN:  Velora Heckler, MD      DATE OF BIRTH:  [birth year]   DATE OF ADMISSION:  03/31/2007  DATE OF DISCHARGE:  04/05/2007                               DISCHARGE SUMMARY   REASON FOR ADMISSION:  Ventral incisional hernia.   HISTORY OF PRESENT ILLNESS:  The patient is a 70 year old white male  with history of colorectal carcinoma.  The patient has developed  multiple ventral incisional hernias, along the previous midline  incision.  He now comes to surgery for repair.   HOSPITAL COURSE:  The patient was admitted on the day of surgery.  He  was taken to the operating room and underwent repair of ventral  incisional hernia with Prolene mesh.  Postoperatively, his course was  complicated by ileus.  This gradually resolved.  The patient required  intravenous narcotics for pain control.  This gradually improved, and he  was switched oral pain medication.  He had return of bowel function.  He  was advanced to a clear liquid diet.  He was then advanced to a regular  diet.  He became ambulatory.  The patient was instructed in drain care.  He was prepared for discharge home on the fifth postoperative day.   DISCHARGE/PLAN:  The patient was discharged home April 05, 2007 in  good condition, tolerating a regular diet, and ambulating independently.  Drains remained in place and will be removed at my office at [facility] on January 23.  The patient is restricted to no  lifting.  He will take Percocet as needed for pain, as well as Aleve.  He will take Milk of Magnesia and MiraLax as needed for constipation.   FINAL DIAGNOSIS:  Ventral incisional hernia.   CONDITION ON DISCHARGE:  Good.      Velora Heckler, MD  Electronically Signed     TMG/MEDQ  D:  04/05/2007  T:  04/05/2007  Job:   [id number]   cc:   Velora Heckler, MD  [address] [place]  [place] [address]

## 2010-07-29 NOTE — Op Note (Signed)
[name], HINCH              ACCOUNT NO.:  [id number]   MEDICAL RECORD NO.:  [id number]          PATIENT TYPE:  INP   LOCATION:  0010                         FACILITY:  [name]   PHYSICIAN:  Velora Heckler, MD      DATE OF BIRTH:  09/21/1940   DATE OF PROCEDURE:  03/31/2007  DATE OF DISCHARGE:                               OPERATIVE REPORT   PREOPERATIVE DIAGNOSIS:  Ventral incisional hernia.   POSTOPERATIVE DIAGNOSIS:  Ventral incisional hernia.   PROCEDURE:  Repair ventral incisional hernia with Prolene mesh.   SURGEON:  Velora Heckler, M.D., FACS   ANESTHESIA:  General.   ESTIMATED BLOOD LOSS:  Minimal.   PREPARATION:  Betadine.   COMPLICATIONS:  None.   INDICATIONS:  The patient is a 70 year old white male with a history of  colorectal carcinoma.  The patient underwent colonic resection August  2006.  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  He received adjuvant chemotherapy.  He  continues to do well.  He has developed multiple ventral incisional  herniae with a very protuberant abdominal wall.  He now presents for  surgery for repair.   BODY OF REPORT:  The procedure was done in OR number 1 at [facility].  The patient was brought to the operating room and placed in a  supine position on the operating room table.  Following the  administration of general anesthesia. the patient was prepped and draped  in the usual strict aseptic fashion.  After ascertaining that an  adequate level of anesthesia had been achieved, the previous midline  incision was reopened with a #10 blade.  A small subcutaneous cystic  lesion in the upper midline is encountered.  This has a greenish-yellow  fluid.  The cyst is localized purely within the subcutaneous tissues and  has no connection to the peritoneal cavity or viscera.  It is excised  and submitted separately to pathology for review.  Aerobic and anaerobic  cultures were also  submitted.  Further dissection allows for entry into  the peritoneal cavity.  There were minimal adhesions of the bowel or  omentum.  The fascial defects were multiple up and down the midline of  the abdominal wall.  The abdominal wall was opened from nearly the  xiphoid process to nearly the pubis connecting all of the defects.  Next, the fascial edge bilaterally is dissected out and defined with the  electrocautery used for hemostasis.  The hernia sac is completely  excised from the subcutaneous tissues bilaterally.  Skin flaps were then  elevated laterally on both sides to the anterior iliac spine.  Relaxing  incisions were made in the anterior rectus sheath bilaterally allowing  for advancement of the rectus muscles to the midline.  Next, the fascia  is reapproximated in the midline with interrupted #1 Novofil sutures.  This repair was not under any significant tension due to the relaxing  incisions.   Next, the repair was buttressed anteriorly with a sheet of Prolene mesh.  An entire 10 inch x 14 inch sheet of mesh  is employed.  The corners were  trimmed slightly.  The mesh was secured to the abdominal wall with  interrupted 0 Novofil sutures placed circumferentially, along the  relaxing incisions, and along the midline closure.  Subcutaneous drains  were brought in from inferior stab wounds.  These were [address]  drains and are secured to the skin with 3-0 nylon sutures.  They are  placed to bulb suction.  The subcutaneous tissues were closed in the  midline with a running 2-0 Vicryl suture.  The skin was closed with  stainless steel staples.  Sterile dressings were applied.  The patient  is awakened from anesthesia and moved to the stretcher.  An abdominal  binder is placed at the time of movement to the stretcher.  The patient  is brought to the recovery room in stable condition.  The patient  tolerated the procedure well.      Velora Heckler, MD  Electronically  Signed     TMG/MEDQ  D:  03/31/2007  T:  03/31/2007  Job:  [id number]   cc:   Dorris Singh Madilyn Fireman, M.D.  Fax: [phone]   Lyn Records, M.D.  Fax: [phone]

## 2010-07-29 NOTE — Cardiovascular Report (Signed)
[name], [name] NO.:  [id number]   MEDICAL RECORD NO.:  [id number]          PATIENT TYPE:  OIB   LOCATION:  1965                         FACILITY:  [facility]   PHYSICIAN:  Lyn Records, M.D.   DATE OF BIRTH:  1940-06-26   DATE OF PROCEDURE:  02/08/2007  DATE OF DISCHARGE:  02/08/2007                            CARDIAC CATHETERIZATION   REASON FOR PROCEDURE:  Exertional chest pain, history of coronary artery  bypass grafting in 2000, recent Cardiolite study demonstrating  inferolateral ischemia.   PROCEDURES PERFORMED:  1. Left heart cath.  2. Selective coronary angio.  3. Left ventriculography.  4. Bypass graft angiography.  5. Internal mammary artery angiography.   DESCRIPTION:  After informed consent, a 4-French sheath was placed in  the right femoral artery using modified Seldinger technique.  A 4-French  A2 multipurpose catheter was used for hemodynamic recordings, left  ventriculography by hand injection, and selective saphenous vein graft  angiography.  The #4-French left Judkins catheter was used for left  coronary angiography.  The internal mammary artery catheter was then  used for native right coronary angiography and for left internal mammary  artery angiography.  The patient tolerated this procedure without  complications.   RESULTS:  1. Hemodynamic data:      a.     Aortic pressure 127/63.      b.     Left ventricular pressure 135/11.  2. Ventriculography:  The left ventricle is normal in size.  There is      mild inferior wall hypokinesis.  The ejection fraction is 65%.  3. Coronary angiography:      a.     Left main coronary:  Diffusely diseased, 50-70% stenosis       noted diffusely.      b.     Left anterior descending coronary:  Heavily calcified and       severely and diffusely diseased throughout the entire proximal       half.  Diagonals seem to arise that contain 50-70% stenosis in at       least two of them.  There is a patent  saphenous vein graft to       diagonal and competitive flow is noted.  LAD also demonstrates       competitive flow in the midvessel, and there is a patent LIMA.      c.     Circumflex artery:  Totally occluded proximally.      d.     Right coronary:  There is a large dominant vessel that       contains irregularities up to 50-80% in the mid and distal vessel.       The PDA is totally occluded proximally.  The right coronary beyond       the PDA origin is 80% stenosed.  A small left ventricular branch       arises distally.  The larger left ventricular branch is totally       occluded.  4. Bypass graft angiography.      a.     Saphenous vein  graft sequential to the PDA and left       ventricular branch.  The graft itself is widely patent until its       distal anastomoses with the distal left ventricular branch.  There       may be a distal anastomotic 60-70% stenosis.  This is not flow       limiting.      b.     Saphenous vein graft to the diagonal:  This graft is widely       patent.      c.     Saphenous vein graft to the obtuse marginal:  This graft is       widely patent.  Circumflex is severely diffusely diseased, both       proximal and distal to the graft insertion site.  5. Left internal mammary graft to the LAD:  The graft is widely      patent, very tortuous, and terminates on the mid-LAD.  There is      disease proximal and distal to the graft insertion site, antegrade      flow is brisk.  No significant obstruction is felt to be present.   CONCLUSIONS:  1. Patent saphenous vein grafts with the exception of 60-70% stenosis      in the distal graft insertion into the right coronary left      ventricular branch.  This is probably not a hemodynamically      significant lesion, although there is no way to be absolutely sure      this distal lesion could account for the inferolateral wall      ischemia.  2. Patent left internal mammary artery to the left anterior       descending.  3. 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  There is also 70-80% stenosis in      the patent native right coronary, both before and after the origin      of the posterior descending artery itself.  The circumflex      territory likely accounts for much of the ischemia noted on the      Cardiolite.  The vessel beyond the graft insertion site is severely      and diffusely diseased and probably is causing the ischemia.  4. Normal left ventricular function.   PLAN:  Aggressive medical therapy.  The patient's anatomy is stable.  He  should do well with general anesthesia.  He is cleared for ventral  herniorrhaphy by Dr. Gerrit Friends in January.  Until they provide assistance,  do not hesitate to call or write.      Lyn Records, M.D.  Electronically Signed     HWS/MEDQ  D:  02/08/2007  T:  02/08/2007  Job:  [id number]   cc:   Velora Heckler, MD  Charlesetta Shanks  Dr. Tawanna Cooler

## 2010-08-01 NOTE — H&P (Signed)
[name], [name]              ACCOUNT NO.:  [id number]   MEDICAL RECORD NO.:  [id number]          PATIENT TYPE:  INP   LOCATION:  2040                         FACILITY:  [facility]   PHYSICIAN:  Kela Millin, M.D.DATE OF BIRTH:  1940/07/07   DATE OF ADMISSION:  05/28/2005  DATE OF DISCHARGE:                                HISTORY & PHYSICAL   PRIMARY CARE PHYSICIAN:  Francis P. Modesto Charon, M.D.   CHIEF COMPLAINT:  Left leg pain and swelling.   HISTORY OF PRESENT ILLNESS:  The patient is a 70 year old white male with a  past medical history significant for colon cancer diagnosed in 2006, status  post surgery and chemotherapy, coronary artery disease, status post CABG x5,  diabetes mellitus, and dyslipidemia, who presents with the above complaints  x2 days.  He denies any recent trauma.  He also denies fevers.  Mr. [name]  also denies chest pain, shortness of breath, diarrhea, dysuria, and no  melena.  He states that about 2 weeks ago he noted some blood in his stool,  and he is scheduled to have a follow up colonoscopy her Dr. Madilyn Fireman on Aug 01, 2005.  He state he has not had any further episodes.   The patient was seen in the ER, and a lower extremity Doppler ultrasound  revealed extensive DVT from the distal calf up to the distal common femoral  vein, and the calf, popliteal and femoral veins are noted to be extremely  dilated and filled with acute thrombosis.  The patient is admitted to the  [facility] hospitalist service for further evaluation and management.   PAST MEDICAL HISTORY:  As stated above.   MEDICATIONS:  1.  Glyburide 5 mg b.i.d.  2.  Avandia 8 mg daily.  3.  Toprol-XL 50 mg daily.  4.  Zetia 40 mg daily.  5.  Metformin 1000 mg b.i.d.  6.  Aspirin 325 mg daily.  7.  Enalapril 20 mg daily.  8.  Lescol 80 mg daily.  9.  Naproxen.   ALLERGIES:  No known drug allergies.   SOCIAL HISTORY:  Denies tobacco.  Occasional alcohol.   FAMILY HISTORY:  His grandmother died  of ovarian cancer.  His sister had  cancer in her aortic area.   REVIEW OF SYSTEMS:  As per HPI.  Other review of systems negative.   PHYSICAL EXAMINATION:  GENERAL:  The patient is an elderly white male, in no  apparent distress.  VITAL SIGNS:  Temperature is 98.1 with a blood pressure of 120/69, pulse 77,  respiratory rate 20, O2 saturation 97%.  HEENT:  Pupils equal, round and reactive to light.  Extraocular movements  intact.  Sclerae anicteric.  No oral exudates.  NECK:  Supple.  No adenopathy, no thyromegaly, and no JVD.  CARDIOVASCULAR:  Regular rate and rhythm.  Normal S1 and S2.  No S3  appreciated.  LUNGS:  Clear to auscultation bilaterally.  No crackles or wheezes.  ABDOMEN:  Soft.  Bowel sounds present.  Nontender, nondistended.  No  organomegaly, no masses palpable.  EXTREMITIES:  He has marked non-pitting edema of  the left lower extremity.  Homans sign present.  No erythema.  No calf tenderness.  The right lower  extremity is within normal limits.  NEUROLOGIC:  Alert and oriented x3.  Cranial nerves II-XII grossly intact.  Nonfocal exam.   LABORATORY DATA:  Doppler ultrasound revealed extensive DVT from the distal  calf up to the distal common femoral vein.  Calf veins, popliteal and  femoral veins extremely dilated and filled with acute thrombosis.   His white cell count is 5.9, hemoglobin 13.4, hematocrit 39.5, platelet  count 204.  INR is 1.0, PT is 13.7.  Sodium 136, potassium 3.8, chloride  104.  CO2 is 26.  Glucose is 134, BUN 10, creatinine 0.8, calcium 9.1.  Total protein is 7.0, albumin is 3.6.  AST is 13, ALT is 14.  Alkaline  phosphatase is 42.  Total bilirubin is 1.0.   ASSESSMENT AND PLAN:  1.  Left lower extremity deep vein thrombosis in a patient with colon      cancer, status post surgery.  Will place the patient on anticoagulation      with heparin and Coumadin and check EKG.  Also check stool guaiacs and      follow.  2.  History of colon cancer,  status post surgery and chemotherapy, followed      by Dr. Myna Hidalgo.  He states he has a follow up colonoscopy planned on Jul 22, 2005 per Dr. Madilyn Fireman.  Will follow stool guaiacs, hemoglobin and      hematocrit, and consult as appropriate.  3.  Diabetes mellitus.  Accu-Chek monitoring.  Oral hypoglycemic and sliding      scale coverage.  4.  Coronary artery disease/status post coronary artery bypass graft.  The      patient is chest pain free.  Follow.  5.  Dyslipidemia.  Continue Lescol.      Kela Millin, M.D.  Electronically Signed     ACV/MEDQ  D:  05/29/2005  T:  05/29/2005  Job:  [id number]   cc:   Thelma Barge P. Modesto Charon, M.D.  Fax: [phone]

## 2010-08-01 NOTE — Procedures (Signed)
[facility]  Patient:    [name]                         MRN: [id number] Proc. Date: 02/03/99 Adm. Date:  [id number] Attending:  Louie Bun CC:         Redmond Baseman, M.D.                           Procedure Report  PROCEDURE PERFORMED:  Colonoscopy with polypectomy.  ENDOSCOPIST:  Everardo All. Madilyn Fireman, M.D.  INDICATIONS FOR PROCEDURE:  Intermittent rectal bleeding in a 70 year old patient with no prior colon screening.  DESCRIPTION OF PROCEDURE:  The patient was placed in the left lateral decubitus  position and placed on the pulse monitor with continuous low flow oxygen delivered by nasal cannula.  He was sedated with 50 mg IV Demerol and 6 mg IV Versed. The Olympus video colonoscope was inserted into the rectum and advanced to the cecum, confirmed by transillumination of McBurneys point and visualization of the ileocecal valve and appendiceal orifice.  The prep was excellent.  The cecum appeared normal.  Within the ascending colon were seen two sessile polyps, one f which was round and 8 mm in diameter.  The other one was somewhat multilobular nd approximately 12 mm in diameter.  These were both fulgurated by hot biopsy. The remainder of the ascending, transverse, descending, sigmoid and rectum all appeared normal with no further polyps, masses, diverticula or other mucosal abnormalities. The rectum likewise appeared normal and a retroflex view of the anus revealed small internal hemorrhoids.  The colonoscope was then withdrawn and the patient returned to the recovery room in stable condition.  He tolerated the procedure well and there were no immediate complications.  IMPRESSION: 1. Ascending colon polyps. 2. Internal hemorrhoids.  PLAN:  Await histology for determination of interval and method for future colon screening. DD:  02/03/99 TD:  02/04/99 Job: [id number]

## 2010-08-01 NOTE — Consult Note (Signed)
Jimmy, Velazquez              ACCOUNT NO.:  [id number]   MEDICAL RECORD NO.:  [id number]          PATIENT TYPE:  INP   LOCATION:  1608                         FACILITY:  [facility]   PHYSICIAN:  Rose Phi. Myna Hidalgo, M.D. DATE OF BIRTH:  06-24-1940   DATE OF CONSULTATION:  11/06/2004  DATE OF DISCHARGE:                                   CONSULTATION   REFERRING PHYSICIAN:  Velora Heckler, MD   REASON FOR CONSULTATION:  Stage IV (T3, N1, M1) adenocarcinoma of the right  colon.   HISTORY OF PRESENT ILLNESS:  Mr. [name] is a very nice 69 year old white  gentleman who lives in [place].  He was initially seen by Dr. Dorena Cookey.  He noted a change in bowel habits with some abdominal pain.  He developed  significant abdominal pain in early July and was seen in the ER.  He was  noted to have a dilated cecum.  He underwent a colonoscopy in July by Dr.  Madilyn Fireman.  This was done on July 25.  A lesion was seen with ulceration in the  right colon.  Biopsies were taken, and pathology report [id number])  revealed adenocarcinoma.   Dr. Madilyn Fireman subsequently referred Mr. Bollig to Dr. Gerrit Friends.  Dr. Gerrit Friends went  ahead and admitted Mr. Larose for surgery.  This was done on November 03, 2004.  A CT scan of the abdomen and pelvis was done prior to surgery.  The  CT scan did not show any evidence of metastatic disease.  There is some wall  thickening in the ascending colon and cecum.   At the time of surgery, Dr. Gerrit Friends went ahead and did a cholecystectomy.  When he was up in the gallbladder area, he saw a tiny superficial lesion on  the liver.  This was biopsied and found to be metastatic disease.   He went ahead and proceeded with a right colectomy.  This was done without  difficulty.  The pathology report [facility] [id number]) revealed invasive,  moderately differentiated adenocarcinoma.  It measured 5.5 cm.  It extended  into the pericolonic fat.  There was lymphovascular invasion.  Three of 23  lymph nodes  were positive.  As such, Mr. Galan is a stage IV (T3, N1, M1)  adenocarcinoma of the right colon.   His postoperative course has been unremarkable to date.   His labs preop showed a white cell count of 7.6, hemoglobin 13, hematocrit  39, platelet count 309.  His electrolytes showed a sodium of 136, potassium  4.6, BUN 9, creatinine 0.9, bilirubin 0.6, total protein 7.1, albumin 3.7.  A preop CEA was not done.   PAST MEDICAL HISTORY:  1.  Hypertension.  2.  Type 2 diabetes.  3.  Hypercholesterolemia.  4.  Coronary artery disease, status post bypass in 2000.   ALLERGIES:  None.   MEDICATIONS:  1.  Glyburide.  2.  Avandia.  3.  Vasotec.  4.  Zetia.  5.  Toprol XL.  6.  Metformin.  7.  Lescol XL.  8.  Aspirin.   SOCIAL HISTORY:  Negative for tobacco use.  He currently works as a Investment banker, operational.  He has rare alcohol use.   FAMILY HISTORY:  Remarkable for diabetes.  There is no obvious history of  cancer in the family.   REVIEW OF SYSTEMS:  A 15-point review of systems did not show any other  signs or symptoms aside that in the history of present illness.  He had no  fevers, sweats, or chills.  There is no bleeding elsewhere.  He had no cough  or shortness of breath.  He had no headache or blurred vision.  He had no  leg swelling.  There are no rashes.   PHYSICAL EXAMINATION:  VITAL SIGNS:  Temperature 97.1, pulse 81, respiratory  rate 20, blood pressure 130/70.  GENERAL:  A well-developed and well-nourished white gentleman in no obvious  distress.  HEENT/NECK:  Normocephalic and atraumatic skull.  There are no ocular or  oral lesions.  There are no palpable cervical or supraclavicular lymph  nodes.  LUNGS:  Clear to auscultation and percussion bilaterally.  HEART:  Regular rate and rhythm with a normal S1 and S2.  There are no  murmurs, rubs, or bruits.  ABDOMEN:  Laparotomy wound is healing.  He has no obvious abdominal mass.  There is no obvious abdominal fluid.  There is no  palpable  hepatosplenomegaly.  EXTREMITIES:  No clubbing, cyanosis or edema.  NEUROLOGIC:  No focal neurological deficits.   IMPRESSION:  Mr. Paulino is a 70 year old gentleman with metastatic colon  cancer; however, this is a unique category of stage IV disease.  Again felt  that his metastatic disease was resected.  There is still a 20-25% chance of  long term survival with aggressive chemotherapy.  He clearly is a good  candidate for systemic chemotherapy.  His overall performance status prior  to his surgery was ECOG 0.  He will need to have a Port-A-Cath placed.  I  would likely use FOLFIRI, as a recent trial seemed to suggest some  superiority of this over FOLFOX.  Given his coronary artery disease and with  stents and bypass, we may have to be careful with using Avastin.  We may  have to go with Erbitux.  I think he does need to be staged with a CT of the  chest, MRI of the liver, and PET scan.  I have not seen the need for a bone  scan.   I spoke with Mr. Hirschman at length.  He is a real nice guy.  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  We will not plan for  chemotherapy until another 2-3 weeks.  We will let him heal up and get home.  I will see him after Labor Day, and we will plan for treatment then.      Rose Phi. Myna Hidalgo, M.D.  Electronically Signed     PRE/MEDQ  D:  11/06/2004  T:  11/06/2004  Job:  [id number]   cc:   Everardo All. Madilyn Fireman, M.D.  [address]  [place]  [place] [address]  Fax: [phone]   Velora Heckler, MD  [phone] [address]  [place]  [place] [address]   Lyn Records, M.D.  [address]  [place]  [place] [address]  Fax: [phone]   Maryla Morrow. Modesto Charon, M.D.  [address]  [place]  [place] [address]  Fax: [phone]

## 2010-12-04 LAB — URINALYSIS, ROUTINE W REFLEX MICROSCOPIC
Glucose, UA: 250 — AB
Ketones, ur: NEGATIVE
Nitrite: NEGATIVE
Protein, ur: NEGATIVE
Urobilinogen, UA: 0.2

## 2010-12-04 LAB — DIFFERENTIAL
Basophils Relative: 0
Eosinophils Absolute: 0.1
Lymphs Abs: 1.5
Neutro Abs: 3.9
Neutrophils Relative %: 64

## 2010-12-04 LAB — CULTURE, ROUTINE-ABSCESS
Culture: NO GROWTH
Gram Stain: NONE SEEN

## 2010-12-04 LAB — BASIC METABOLIC PANEL
BUN: 21
CO2: 28
Calcium: 9.8
Calcium: 8.6
Chloride: 106
Chloride: 100
Creatinine, Ser: 1.07
GFR calc Af Amer: >60
GFR calc Af Amer: >60
Potassium: 4.6
Sodium: 137

## 2010-12-04 LAB — ANAEROBIC CULTURE

## 2010-12-04 LAB — CBC
Hemoglobin: 14.4
MCHC: 34.8
MCHC: 34.9
MCV: 85.2
Platelets: 232
RBC: 4.91
RBC: 4.81
WBC: 6.1

## 2010-12-04 LAB — PROTIME-INR: INR: 0.9

## 2010-12-23 LAB — POCT I-STAT GLUCOSE: Glucose, Bld: 232 — ABNORMAL HIGH

## 2010-12-31 LAB — CBC
HCT: 37.5 — ABNORMAL LOW
Hemoglobin: 13
Hemoglobin: 15.4
MCHC: 34.6
MCV: 85.6
Platelets: 159
RBC: 4.4
RBC: 5.26
RDW: 13.5
WBC: 8.7
WBC: 9.5

## 2010-12-31 LAB — COMPREHENSIVE METABOLIC PANEL
ALT: 23
ALT: 282 — ABNORMAL HIGH
Alkaline Phosphatase: 44
CO2: 28
CO2: 28
Calcium: 8.5
Chloride: 105
Chloride: 105
GFR calc non Af Amer: >60
GFR calc non Af Amer: >60
Glucose, Bld: 128 — ABNORMAL HIGH
Glucose, Bld: 218 — ABNORMAL HIGH
Potassium: 4.8
Sodium: 138
Sodium: 141
Total Bilirubin: 1.2

## 2010-12-31 LAB — URINALYSIS, ROUTINE W REFLEX MICROSCOPIC
Bilirubin Urine: NEGATIVE
Nitrite: NEGATIVE
Specific Gravity, Urine: 1.017
pH: 6

## 2010-12-31 LAB — CROSSMATCH

## 2010-12-31 LAB — PROTIME-INR: Prothrombin Time: 13.5

## 2011-09-15 ENCOUNTER — Other Ambulatory Visit: Payer: Self-pay | Admitting: Neurosurgery

## 2011-09-15 DIAGNOSIS — M549 Dorsalgia, unspecified: Secondary | ICD-10-CM

## 2011-09-15 DIAGNOSIS — M542 Cervicalgia: Secondary | ICD-10-CM

## 2011-09-24 ENCOUNTER — Ambulatory Visit
Admission: RE | Admit: 2011-09-24 | Discharge: 2011-09-24 | Disposition: A | Discharge status: Home / Self Care | Payer: PRIVATE HEALTH INSURANCE | Source: Ambulatory Visit | Attending: Neurosurgery | Admitting: Neurosurgery

## 2011-09-24 DIAGNOSIS — M542 Cervicalgia: Secondary | ICD-10-CM

## 2011-09-24 DIAGNOSIS — M549 Dorsalgia, unspecified: Secondary | ICD-10-CM

## 2011-09-24 MED ORDER — GADOBENATE DIMEGLUMINE 529 MG/ML IV SOLN
20.0000 mL | Freq: Once | INTRAVENOUS | Status: AC | PRN
  Administered 2011-09-24: 20 mL via INTRAVENOUS

## 2011-10-14 ENCOUNTER — Other Ambulatory Visit: Payer: Self-pay | Admitting: Neurosurgery

## 2011-10-29 ENCOUNTER — Encounter (HOSPITAL_COMMUNITY): Payer: Self-pay | Admitting: Pharmacy Technician

## 2011-11-03 NOTE — Pre-Procedure Instructions (Signed)
20 [name]   11/03/2011   Your procedure is scheduled on:  AUGUST 29TH, Thursday   Report to [facility] at  5:30 AM.             Surgery time is posted from 7:30 AM - 11:15 AM   Call this number if you have problems the morning of surgery: [phone]   Remember:   Do not eat food or drink any liquids:After Midnight. Thursday   Take these medicines the morning of surgery with A SIP OF WATER: Atenolol & Zoloft              Discontinue Aspirin, Plavix, Effient and any herbal medications 5 days prior to surgery.   Do not wear jewelry.  Do not wear lotions, powders, or cologne. You may NOT wear deodorant.             Men may shave face and neck.   Do not bring valuables to the hospital.  Contacts, dentures or bridgework may not be worn into surgery.   Leave suitcase in the car. After surgery it may be brought to your room.  For patients admitted to the hospital, checkout time is 11:00 AM the day of discharge.   Patients discharged the day of surgery will not be allowed to drive home.  Name and phone number of your driver:     Special Instructions: CHG Shower Use Special Wash: 1/2 bottle night before surgery and 1/2 bottle morning of surgery.   Please read over the following fact sheets that you were given: Pain Booklet, Coughing and Deep Breathing, Blood Transfusion Information, MRSA Information and Surgical Site Infection Prevention

## 2011-11-04 ENCOUNTER — Encounter (HOSPITAL_COMMUNITY): Payer: Self-pay

## 2011-11-04 ENCOUNTER — Encounter (HOSPITAL_COMMUNITY)
Admission: RE | Admit: 2011-11-04 | Discharge: 2011-11-04 | Disposition: A | Discharge status: Home / Self Care | Payer: Medicare Other | Source: Ambulatory Visit | Attending: Neurosurgery | Admitting: Neurosurgery

## 2011-11-04 HISTORY — DX: Depression, unspecified: F32.A

## 2011-11-04 HISTORY — DX: Atherosclerotic heart disease of native coronary artery without angina pectoris: I25.10

## 2011-11-04 HISTORY — DX: Essential (primary) hypertension: I10

## 2011-11-04 HISTORY — DX: Gastro-esophageal reflux disease without esophagitis: K21.9

## 2011-11-04 HISTORY — DX: Major depressive disorder, single episode, unspecified: F32.9

## 2011-11-04 HISTORY — DX: Unspecified osteoarthritis, unspecified site: M19.90

## 2011-11-04 LAB — BASIC METABOLIC PANEL
BUN: 20 mg/dL (ref 6–23)
Creatinine, Ser: 1.04 mg/dL (ref 0.50–1.35)
GFR calc Af Amer: 83 mL/min — ABNORMAL LOW (ref >90)
GFR calc non Af Amer: 71 mL/min — ABNORMAL LOW (ref >90)
Potassium: 4.5 mEq/L (ref 3.5–5.1)

## 2011-11-04 LAB — CBC
MCHC: 33.6 g/dL (ref 30.0–36.0)
Platelets: 176 10*3/uL (ref 150–400)
RDW: 13.7 % (ref 11.5–15.5)
WBC: 7.5 10*3/uL (ref 4.0–10.5)

## 2011-11-04 LAB — TYPE AND SCREEN
ABO/RH(D): A POS
Antibody Screen: NEGATIVE

## 2011-11-04 NOTE — Progress Notes (Signed)
SPOKE WITH MICHAEL WITH ANESTHESIA RE; HX CABG 2000 AND HAS OCC CHEST PAIN(RECENTLY HAD THIS MORNING).  PATIENT STATED DR Katrinka Blazing IS AWARE OF CHEST PAIN AND HAS CLEARED HIM FOR SURGERY. CARDIAC CLEARANCE AND STRESS TEST ARE ON CHART. CARDIAC CATH FROM 2008 ALSO ON CHART.

## 2011-11-06 ENCOUNTER — Encounter (HOSPITAL_COMMUNITY): Payer: Self-pay

## 2011-11-11 MED ORDER — VANCOMYCIN HCL 1000 MG IV SOLR
1500.0000 mg | INTRAVENOUS | Status: AC
  Administered 2011-11-12: 1500 mg via INTRAVENOUS
  Filled 2011-11-11: qty 1500

## 2011-11-12 ENCOUNTER — Inpatient Hospital Stay (HOSPITAL_COMMUNITY): Payer: Medicare Other | Admitting: Certified Registered"

## 2011-11-12 ENCOUNTER — Encounter (HOSPITAL_COMMUNITY): Payer: Self-pay | Admitting: Certified Registered"

## 2011-11-12 ENCOUNTER — Encounter (HOSPITAL_COMMUNITY): Payer: Self-pay | Admitting: *Deleted

## 2011-11-12 ENCOUNTER — Encounter (HOSPITAL_COMMUNITY): Payer: Self-pay | Admitting: General Practice

## 2011-11-12 ENCOUNTER — Inpatient Hospital Stay (HOSPITAL_COMMUNITY): Payer: Medicare Other

## 2011-11-12 ENCOUNTER — Inpatient Hospital Stay (HOSPITAL_COMMUNITY)
Admission: RE | Admit: 2011-11-12 | Discharge: 2011-11-20 | DRG: 460 | Disposition: A | Discharge status: Skilled Nursing Facility | Payer: Medicare Other | Source: Ambulatory Visit | Attending: Neurosurgery | Admitting: Neurosurgery

## 2011-11-12 ENCOUNTER — Encounter (HOSPITAL_COMMUNITY)
Admission: RE | Disposition: A | Discharge status: Skilled Nursing Facility | Payer: Self-pay | Source: Ambulatory Visit | Attending: Neurosurgery

## 2011-11-12 DIAGNOSIS — E1142 Type 2 diabetes mellitus with diabetic polyneuropathy: Secondary | ICD-10-CM | POA: Diagnosis present

## 2011-11-12 DIAGNOSIS — K219 Gastro-esophageal reflux disease without esophagitis: Secondary | ICD-10-CM | POA: Diagnosis present

## 2011-11-12 DIAGNOSIS — M51379 Other intervertebral disc degeneration, lumbosacral region without mention of lumbar back pain or lower extremity pain: Secondary | ICD-10-CM | POA: Diagnosis present

## 2011-11-12 DIAGNOSIS — E1149 Type 2 diabetes mellitus with other diabetic neurological complication: Secondary | ICD-10-CM | POA: Diagnosis present

## 2011-11-12 DIAGNOSIS — Z951 Presence of aortocoronary bypass graft: Secondary | ICD-10-CM

## 2011-11-12 DIAGNOSIS — F329 Major depressive disorder, single episode, unspecified: Secondary | ICD-10-CM | POA: Diagnosis present

## 2011-11-12 DIAGNOSIS — Z01810 Encounter for preprocedural cardiovascular examination: Secondary | ICD-10-CM

## 2011-11-12 DIAGNOSIS — M5137 Other intervertebral disc degeneration, lumbosacral region: Secondary | ICD-10-CM | POA: Diagnosis present

## 2011-11-12 DIAGNOSIS — Z79899 Other long term (current) drug therapy: Secondary | ICD-10-CM

## 2011-11-12 DIAGNOSIS — Z86718 Personal history of other venous thrombosis and embolism: Secondary | ICD-10-CM

## 2011-11-12 DIAGNOSIS — I2510 Atherosclerotic heart disease of native coronary artery without angina pectoris: Secondary | ICD-10-CM | POA: Diagnosis present

## 2011-11-12 DIAGNOSIS — Z7982 Long term (current) use of aspirin: Secondary | ICD-10-CM

## 2011-11-12 DIAGNOSIS — I739 Peripheral vascular disease, unspecified: Secondary | ICD-10-CM | POA: Diagnosis present

## 2011-11-12 DIAGNOSIS — E669 Obesity, unspecified: Secondary | ICD-10-CM | POA: Diagnosis present

## 2011-11-12 DIAGNOSIS — Z01818 Encounter for other preprocedural examination: Secondary | ICD-10-CM

## 2011-11-12 DIAGNOSIS — Q762 Congenital spondylolisthesis: Principal | ICD-10-CM

## 2011-11-12 DIAGNOSIS — F3289 Other specified depressive episodes: Secondary | ICD-10-CM | POA: Diagnosis present

## 2011-11-12 DIAGNOSIS — M4316 Spondylolisthesis, lumbar region: Secondary | ICD-10-CM

## 2011-11-12 DIAGNOSIS — Z85038 Personal history of other malignant neoplasm of large intestine: Secondary | ICD-10-CM

## 2011-11-12 DIAGNOSIS — Z01812 Encounter for preprocedural laboratory examination: Secondary | ICD-10-CM

## 2011-11-12 DIAGNOSIS — E7800 Pure hypercholesterolemia, unspecified: Secondary | ICD-10-CM | POA: Diagnosis present

## 2011-11-12 DIAGNOSIS — I10 Essential (primary) hypertension: Secondary | ICD-10-CM | POA: Diagnosis present

## 2011-11-12 HISTORY — PX: POSTERIOR FUSION LUMBAR SPINE: SUR632

## 2011-11-12 HISTORY — DX: Other chronic pain: G89.29

## 2011-11-12 HISTORY — DX: Type 2 diabetes mellitus without complications: E11.9

## 2011-11-12 HISTORY — DX: Low back pain, unspecified: M54.50

## 2011-11-12 HISTORY — DX: Low back pain: M54.5

## 2011-11-12 LAB — GLUCOSE, CAPILLARY: Glucose-Capillary: 113 mg/dL — ABNORMAL HIGH (ref 70–99)

## 2011-11-12 SURGERY — POSTERIOR LUMBAR FUSION 1 LEVEL
Anesthesia: General | Site: Spine Lumbar | Wound class: Clean

## 2011-11-12 MED ORDER — INSULIN ASPART 100 UNIT/ML ~~LOC~~ SOLN: 0.0000 [IU] | Freq: Three times a day (TID) | SUBCUTANEOUS | Status: DC

## 2011-11-12 MED ORDER — GLYCOPYRROLATE 0.2 MG/ML IJ SOLN
INTRAMUSCULAR | Status: DC | PRN
  Administered 2011-11-12: 0.4 mg via INTRAVENOUS

## 2011-11-12 MED ORDER — FENTANYL CITRATE 0.05 MG/ML IJ SOLN: 50.0000 ug | INTRAMUSCULAR | Status: DC | PRN

## 2011-11-12 MED ORDER — ZOLPIDEM TARTRATE 5 MG PO TABS: 5.0000 mg | ORAL_TABLET | Freq: Every evening | ORAL | Status: DC | PRN

## 2011-11-12 MED ORDER — MENTHOL 3 MG MT LOZG: 1.0000 | LOZENGE | OROMUCOSAL | Status: DC | PRN

## 2011-11-12 MED ORDER — HYDROCODONE-ACETAMINOPHEN 5-325 MG PO TABS
1.0000 | ORAL_TABLET | ORAL | Status: DC | PRN
  Administered 2011-11-16 – 2011-11-20 (×8): 2 via ORAL
  Filled 2011-11-12 (×2): qty 1
  Filled 2011-11-12 (×8): qty 2

## 2011-11-12 MED ORDER — SODIUM CHLORIDE 0.9 % IV SOLN
INTRAVENOUS | Status: AC
  Filled 2011-11-12: qty 500

## 2011-11-12 MED ORDER — HYDROMORPHONE HCL PF 1 MG/ML IJ SOLN
INTRAMUSCULAR | Status: AC
  Filled 2011-11-12: qty 1

## 2011-11-12 MED ORDER — HYDROMORPHONE HCL PF 1 MG/ML IJ SOLN
0.2500 mg | INTRAMUSCULAR | Status: DC | PRN
  Administered 2011-11-12 (×2): 0.5 mg via INTRAVENOUS

## 2011-11-12 MED ORDER — GLYBURIDE 5 MG PO TABS
5.0000 mg | ORAL_TABLET | Freq: Two times a day (BID) | ORAL | Status: DC
  Administered 2011-11-12 – 2011-11-20 (×11): 5 mg via ORAL
  Filled 2011-11-12 (×18): qty 1

## 2011-11-12 MED ORDER — ENALAPRIL MALEATE 10 MG PO TABS
10.0000 mg | ORAL_TABLET | Freq: Every morning | ORAL | Status: DC
  Administered 2011-11-13 – 2011-11-20 (×7): 10 mg via ORAL
  Filled 2011-11-12 (×8): qty 1

## 2011-11-12 MED ORDER — ROCURONIUM BROMIDE 100 MG/10ML IV SOLN
INTRAVENOUS | Status: DC | PRN
  Administered 2011-11-12: 50 mg via INTRAVENOUS

## 2011-11-12 MED ORDER — BUPIVACAINE-EPINEPHRINE PF 0.5-1:200000 % IJ SOLN
INTRAMUSCULAR | Status: DC | PRN
  Administered 2011-11-12: 10 mL

## 2011-11-12 MED ORDER — DIPHENHYDRAMINE HCL 50 MG/ML IJ SOLN: 12.5000 mg | Freq: Four times a day (QID) | INTRAMUSCULAR | Status: DC | PRN

## 2011-11-12 MED ORDER — PROMETHAZINE HCL 25 MG/ML IJ SOLN: 6.2500 mg | INTRAMUSCULAR | Status: DC | PRN

## 2011-11-12 MED ORDER — MORPHINE SULFATE (PF) 1 MG/ML IV SOLN
INTRAVENOUS | Status: DC
  Administered 2011-11-12: 2 mg via INTRAVENOUS
  Administered 2011-11-12: 14:00:00 via INTRAVENOUS
  Administered 2011-11-12: 9 mg via INTRAVENOUS
  Administered 2011-11-12: 5 mg via INTRAVENOUS
  Administered 2011-11-13 (×2): 3 mg via INTRAVENOUS
  Administered 2011-11-13: 07:00:00 via INTRAVENOUS
  Administered 2011-11-14: 1 mg via INTRAVENOUS
  Administered 2011-11-14: 4 mg via INTRAVENOUS
  Administered 2011-11-14: 1 mg via INTRAVENOUS
  Filled 2011-11-12: qty 25

## 2011-11-12 MED ORDER — MIDAZOLAM HCL 5 MG/5ML IJ SOLN
INTRAMUSCULAR | Status: DC | PRN
  Administered 2011-11-12: 2 mg via INTRAVENOUS

## 2011-11-12 MED ORDER — ACETAMINOPHEN 10 MG/ML IV SOLN
INTRAVENOUS | Status: DC | PRN
  Administered 2011-11-12: 1000 mg via INTRAVENOUS

## 2011-11-12 MED ORDER — PHENOL 1.4 % MT LIQD: 1.0000 | OROMUCOSAL | Status: DC | PRN

## 2011-11-12 MED ORDER — BACITRACIN ZINC 500 UNIT/GM EX OINT
TOPICAL_OINTMENT | CUTANEOUS | Status: DC | PRN
  Administered 2011-11-12: 1 via TOPICAL

## 2011-11-12 MED ORDER — 0.9 % SODIUM CHLORIDE (POUR BTL) OPTIME
TOPICAL | Status: DC | PRN
  Administered 2011-11-12: 1000 mL

## 2011-11-12 MED ORDER — NITROGLYCERIN 0.4 MG SL SUBL
0.4000 mg | SUBLINGUAL_TABLET | SUBLINGUAL | Status: DC | PRN
  Administered 2011-11-17: 0.4 mg via SUBLINGUAL
  Filled 2011-11-12: qty 25

## 2011-11-12 MED ORDER — SODIUM CHLORIDE 0.9 % IR SOLN
Status: DC | PRN
  Administered 2011-11-12: 09:00:00

## 2011-11-12 MED ORDER — LACTATED RINGERS IV SOLN
INTRAVENOUS | Status: DC | PRN
  Administered 2011-11-12 (×3): via INTRAVENOUS

## 2011-11-12 MED ORDER — SUFENTANIL CITRATE 50 MCG/ML IV SOLN
INTRAVENOUS | Status: DC | PRN
  Administered 2011-11-12: 15 ug via INTRAVENOUS
  Administered 2011-11-12: 10 ug via INTRAVENOUS
  Administered 2011-11-12: 5 ug via INTRAVENOUS
  Administered 2011-11-12 (×2): 10 ug via INTRAVENOUS

## 2011-11-12 MED ORDER — ACETAMINOPHEN 325 MG PO TABS: 650.0000 mg | ORAL_TABLET | ORAL | Status: DC | PRN

## 2011-11-12 MED ORDER — DIAZEPAM 5 MG PO TABS
5.0000 mg | ORAL_TABLET | Freq: Four times a day (QID) | ORAL | Status: DC | PRN
  Administered 2011-11-12 – 2011-11-20 (×8): 5 mg via ORAL
  Filled 2011-11-12 (×9): qty 1

## 2011-11-12 MED ORDER — DOCUSATE SODIUM 100 MG PO CAPS
100.0000 mg | ORAL_CAPSULE | Freq: Two times a day (BID) | ORAL | Status: DC
  Administered 2011-11-12 – 2011-11-20 (×16): 100 mg via ORAL
  Filled 2011-11-12 (×15): qty 1

## 2011-11-12 MED ORDER — MIDAZOLAM HCL 2 MG/2ML IJ SOLN: 1.0000 mg | INTRAMUSCULAR | Status: DC | PRN

## 2011-11-12 MED ORDER — PROPOFOL 10 MG/ML IV BOLUS
INTRAVENOUS | Status: DC | PRN
  Administered 2011-11-12: 160 mg via INTRAVENOUS

## 2011-11-12 MED ORDER — ADULT MULTIVITAMIN W/MINERALS CH
1.0000 | ORAL_TABLET | Freq: Every day | ORAL | Status: DC
  Administered 2011-11-12 – 2011-11-20 (×9): 1 via ORAL
  Filled 2011-11-12 (×9): qty 1

## 2011-11-12 MED ORDER — ACETAMINOPHEN 650 MG RE SUPP: 650.0000 mg | RECTAL | Status: DC | PRN

## 2011-11-12 MED ORDER — ONDANSETRON HCL 4 MG/2ML IJ SOLN: 4.0000 mg | Freq: Four times a day (QID) | INTRAMUSCULAR | Status: DC | PRN

## 2011-11-12 MED ORDER — VANCOMYCIN HCL IN DEXTROSE 1-5 GM/200ML-% IV SOLN
1000.0000 mg | Freq: Once | INTRAVENOUS | Status: AC
  Administered 2011-11-12: 1000 mg via INTRAVENOUS
  Filled 2011-11-12: qty 200

## 2011-11-12 MED ORDER — INSULIN ASPART 100 UNIT/ML ~~LOC~~ SOLN: 0.0000 [IU] | Freq: Every day | SUBCUTANEOUS | Status: DC

## 2011-11-12 MED ORDER — DIPHENHYDRAMINE HCL 12.5 MG/5ML PO ELIX: 12.5000 mg | ORAL_SOLUTION | Freq: Four times a day (QID) | ORAL | Status: DC | PRN

## 2011-11-12 MED ORDER — DEXTROSE 50 % IV SOLN
INTRAVENOUS | Status: AC
  Filled 2011-11-12: qty 50

## 2011-11-12 MED ORDER — ALBUMIN HUMAN 5 % IV SOLN
INTRAVENOUS | Status: DC | PRN
  Administered 2011-11-12 (×2): via INTRAVENOUS

## 2011-11-12 MED ORDER — ACETAMINOPHEN 10 MG/ML IV SOLN
INTRAVENOUS | Status: AC
  Filled 2011-11-12: qty 100

## 2011-11-12 MED ORDER — OXYCODONE-ACETAMINOPHEN 5-325 MG PO TABS
1.0000 | ORAL_TABLET | ORAL | Status: DC | PRN
  Administered 2011-11-12 – 2011-11-20 (×20): 2 via ORAL
  Filled 2011-11-12 (×22): qty 2

## 2011-11-12 MED ORDER — SIMVASTATIN 40 MG PO TABS
40.0000 mg | ORAL_TABLET | Freq: Every day | ORAL | Status: DC
  Administered 2011-11-12 – 2011-11-19 (×8): 40 mg via ORAL
  Filled 2011-11-12 (×9): qty 1

## 2011-11-12 MED ORDER — BUPIVACAINE LIPOSOME 1.3 % IJ SUSP
20.0000 mL | Freq: Once | INTRAMUSCULAR | Status: DC
  Filled 2011-11-12 (×2): qty 20

## 2011-11-12 MED ORDER — SODIUM CHLORIDE 0.9 % IJ SOLN: 9.0000 mL | INTRAMUSCULAR | Status: DC | PRN

## 2011-11-12 MED ORDER — HEPARIN SODIUM (PORCINE) 1000 UNIT/ML IJ SOLN
INTRAMUSCULAR | Status: AC
  Filled 2011-11-12: qty 1

## 2011-11-12 MED ORDER — ATENOLOL 50 MG PO TABS
50.0000 mg | ORAL_TABLET | Freq: Every day | ORAL | Status: DC
  Administered 2011-11-12 – 2011-11-20 (×8): 50 mg via ORAL
  Filled 2011-11-12 (×9): qty 1

## 2011-11-12 MED ORDER — INSULIN ASPART 100 UNIT/ML ~~LOC~~ SOLN: 0.0000 [IU] | SUBCUTANEOUS | Status: DC

## 2011-11-12 MED ORDER — MORPHINE SULFATE (PF) 1 MG/ML IV SOLN
INTRAVENOUS | Status: AC
  Filled 2011-11-12: qty 25

## 2011-11-12 MED ORDER — BACITRACIN 50000 UNITS IM SOLR
INTRAMUSCULAR | Status: AC
  Filled 2011-11-12: qty 1

## 2011-11-12 MED ORDER — ATENOLOL 50 MG PO TABS: 50.0000 mg | ORAL_TABLET | Freq: Once | ORAL | Status: DC

## 2011-11-12 MED ORDER — INSULIN GLARGINE 100 UNIT/ML ~~LOC~~ SOLN
60.0000 [IU] | Freq: Every day | SUBCUTANEOUS | Status: DC
  Administered 2011-11-12 – 2011-11-17 (×3): 60 [IU] via SUBCUTANEOUS

## 2011-11-12 MED ORDER — TAMSULOSIN HCL 0.4 MG PO CAPS
0.4000 mg | ORAL_CAPSULE | Freq: Every day | ORAL | Status: DC
  Administered 2011-11-12 – 2011-11-20 (×9): 0.4 mg via ORAL
  Filled 2011-11-12 (×9): qty 1

## 2011-11-12 MED ORDER — METFORMIN HCL 500 MG PO TABS
1000.0000 mg | ORAL_TABLET | Freq: Two times a day (BID) | ORAL | Status: DC
  Administered 2011-11-12 – 2011-11-20 (×11): 1000 mg via ORAL
  Filled 2011-11-12 (×18): qty 2

## 2011-11-12 MED ORDER — LIDOCAINE HCL (CARDIAC) 20 MG/ML IV SOLN
INTRAVENOUS | Status: DC | PRN
  Administered 2011-11-12: 100 mg via INTRAVENOUS

## 2011-11-12 MED ORDER — SERTRALINE HCL 100 MG PO TABS
100.0000 mg | ORAL_TABLET | Freq: Every day | ORAL | Status: DC
  Administered 2011-11-12 – 2011-11-20 (×9): 100 mg via ORAL
  Filled 2011-11-12 (×9): qty 1

## 2011-11-12 MED ORDER — ONDANSETRON HCL 4 MG/2ML IJ SOLN: 4.0000 mg | INTRAMUSCULAR | Status: DC | PRN

## 2011-11-12 MED ORDER — BUPIVACAINE LIPOSOME 1.3 % IJ SUSP
INTRAMUSCULAR | Status: DC | PRN
  Administered 2011-11-12: 20 mL

## 2011-11-12 MED ORDER — ONDANSETRON HCL 4 MG/2ML IJ SOLN
INTRAMUSCULAR | Status: DC | PRN
  Administered 2011-11-12: 4 mg via INTRAVENOUS

## 2011-11-12 MED ORDER — NEOSTIGMINE METHYLSULFATE 1 MG/ML IJ SOLN
INTRAMUSCULAR | Status: DC | PRN
  Administered 2011-11-12: 3 mg via INTRAVENOUS

## 2011-11-12 MED ORDER — INSULIN ASPART 100 UNIT/ML ~~LOC~~ SOLN
0.0000 [IU] | Freq: Three times a day (TID) | SUBCUTANEOUS | Status: DC
  Administered 2011-11-14: 3 [IU] via SUBCUTANEOUS
  Administered 2011-11-15: 4 [IU] via SUBCUTANEOUS
  Administered 2011-11-16: 3 [IU] via SUBCUTANEOUS
  Administered 2011-11-17 (×2): via SUBCUTANEOUS
  Administered 2011-11-18: 4 [IU] via SUBCUTANEOUS
  Administered 2011-11-18: 1 [IU] via SUBCUTANEOUS
  Administered 2011-11-19: 4 [IU] via SUBCUTANEOUS
  Administered 2011-11-19 – 2011-11-20 (×3): 3 [IU] via SUBCUTANEOUS

## 2011-11-12 MED ORDER — DEXTROSE 50 % IV SOLN
25.0000 mL | Freq: Once | INTRAVENOUS | Status: AC
  Administered 2011-11-12: 25 mL via INTRAVENOUS

## 2011-11-12 MED ORDER — THROMBIN 20000 UNITS EX SOLR
CUTANEOUS | Status: DC | PRN
  Administered 2011-11-12: 09:00:00 via TOPICAL

## 2011-11-12 MED ORDER — NALOXONE HCL 0.4 MG/ML IJ SOLN: 0.4000 mg | INTRAMUSCULAR | Status: DC | PRN

## 2011-11-12 MED ORDER — LACTATED RINGERS IV SOLN
INTRAVENOUS | Status: DC
  Administered 2011-11-12: 18:00:00 via INTRAVENOUS

## 2011-11-12 MED ORDER — PHENYLEPHRINE HCL 10 MG/ML IJ SOLN
10.0000 mg | INTRAMUSCULAR | Status: DC | PRN
  Administered 2011-11-12: 40 ug/min via INTRAVENOUS

## 2011-11-12 SURGICAL SUPPLY — 69 items
APL SKNCLS STERI-STRIP NONHPOA (GAUZE/BANDAGES/DRESSINGS) ×1
BAG DECANTER FOR FLEXI CONT (MISCELLANEOUS) ×2 IMPLANT
BENZOIN TINCTURE PRP APPL 2/3 (GAUZE/BANDAGES/DRESSINGS) ×2 IMPLANT
BLADE SURG ROTATE 9660 (MISCELLANEOUS) ×1 IMPLANT
BRUSH SCRUB EZ PLAIN DRY (MISCELLANEOUS) ×2 IMPLANT
BUR ACORN 6.0 (BURR) ×2 IMPLANT
BUR MATCHSTICK NEURO 3.0 LAGG (BURR) ×2 IMPLANT
CANISTER SUCTION 2500CC (MISCELLANEOUS) ×2 IMPLANT
CAP REVERE LOCKING (Cap) ×4 IMPLANT
CLOTH BEACON ORANGE TIMEOUT ST (SAFETY) ×2 IMPLANT
CONT SPEC 4OZ CLIKSEAL STRL BL (MISCELLANEOUS) ×3 IMPLANT
COVER BACK TABLE 24X17X13 BIG (DRAPES) IMPLANT
COVER TABLE BACK 60X90 (DRAPES) ×2 IMPLANT
DRAPE C-ARM 42X72 X-RAY (DRAPES) ×4 IMPLANT
DRAPE LAPAROTOMY 100X72X124 (DRAPES) ×2 IMPLANT
DRAPE POUCH INSTRU U-SHP 10X18 (DRAPES) ×2 IMPLANT
DRAPE SURG 17X23 STRL (DRAPES) ×8 IMPLANT
ELECT BLADE 4.0 EZ CLEAN MEGAD (MISCELLANEOUS) ×2
ELECT REM PT RETURN 9FT ADLT (ELECTROSURGICAL) ×2
ELECTRODE BLDE 4.0 EZ CLN MEGD (MISCELLANEOUS) ×1 IMPLANT
ELECTRODE REM PT RTRN 9FT ADLT (ELECTROSURGICAL) ×1 IMPLANT
GAUZE SPONGE 4X4 16PLY XRAY LF (GAUZE/BANDAGES/DRESSINGS) ×1 IMPLANT
GLOVE BIO SURGEON STRL SZ8.5 (GLOVE) ×4 IMPLANT
GLOVE BIOGEL PI IND STRL 7.0 (GLOVE) IMPLANT
GLOVE BIOGEL PI INDICATOR 7.0 (GLOVE) ×2
GLOVE ECLIPSE 7.5 STRL STRAW (GLOVE) ×1 IMPLANT
GLOVE EXAM NITRILE LRG STRL (GLOVE) ×1 IMPLANT
GLOVE EXAM NITRILE MD LF STRL (GLOVE) ×1 IMPLANT
GLOVE EXAM NITRILE XL STR (GLOVE) IMPLANT
GLOVE EXAM NITRILE XS STR PU (GLOVE) IMPLANT
GLOVE SS BIOGEL STRL SZ 8 (GLOVE) ×2 IMPLANT
GLOVE SUPERSENSE BIOGEL SZ 8 (GLOVE) ×2
GLOVE SURG SS PI 6.5 STRL IVOR (GLOVE) ×3 IMPLANT
GOWN BRE IMP SLV AUR LG STRL (GOWN DISPOSABLE) ×2 IMPLANT
GOWN BRE IMP SLV AUR XL STRL (GOWN DISPOSABLE) ×4 IMPLANT
GOWN STRL REIN 2XL LVL4 (GOWN DISPOSABLE) IMPLANT
KIT BASIN OR (CUSTOM PROCEDURE TRAY) ×2 IMPLANT
KIT ROOM TURNOVER OR (KITS) ×2 IMPLANT
MILL MEDIUM DISP (BLADE) ×1 IMPLANT
NDL HYPO 21X1.5 SAFETY (NEEDLE) IMPLANT
NEEDLE HYPO 21X1.5 SAFETY (NEEDLE) ×2 IMPLANT
NEEDLE HYPO 22GX1.5 SAFETY (NEEDLE) ×2 IMPLANT
NS IRRIG 1000ML POUR BTL (IV SOLUTION) ×2 IMPLANT
PACK FOAM VITOSS 10CC (Orthopedic Implant) ×1 IMPLANT
PACK LAMINECTOMY NEURO (CUSTOM PROCEDURE TRAY) ×2 IMPLANT
PAD ARMBOARD 7.5X6 YLW CONV (MISCELLANEOUS) ×8 IMPLANT
PATTIES SURGICAL .5 X1 (DISPOSABLE) ×1 IMPLANT
PATTIES SURGICAL 1X1 (DISPOSABLE) ×1 IMPLANT
PUTTY 10ML ACTIFUSE ABX (Putty) ×1 IMPLANT
PUTTY 5ML ACTIFUSE ABX (Putty) ×1 IMPLANT
ROD REVERE CURVED 6.35X35MM (Rod) ×2 IMPLANT
SCREW 6.5X60 (Screw) ×2 IMPLANT
SCREW REVERE 6.35 6.5MMX45 (Screw) ×2 IMPLANT
SPACER SUSTAIN O SML 10X22 8MM (Peek) ×2 IMPLANT
SPONGE GAUZE 4X4 12PLY (GAUZE/BANDAGES/DRESSINGS) ×2 IMPLANT
SPONGE LAP 4X18 X RAY DECT (DISPOSABLE) IMPLANT
SPONGE NEURO XRAY DETECT 1X3 (DISPOSABLE) IMPLANT
SPONGE SURGIFOAM ABS GEL 100 (HEMOSTASIS) ×2 IMPLANT
STRIP CLOSURE SKIN 1/2X4 (GAUZE/BANDAGES/DRESSINGS) ×2 IMPLANT
SUT VIC AB 1 CT1 18XBRD ANBCTR (SUTURE) ×2 IMPLANT
SUT VIC AB 1 CT1 8-18 (SUTURE) ×4
SUT VIC AB 2-0 CP2 18 (SUTURE) ×4 IMPLANT
SYR 20CC LL (SYRINGE) ×1 IMPLANT
SYR 20ML ECCENTRIC (SYRINGE) ×2 IMPLANT
TAPE CLOTH SURG 4X10 WHT LF (GAUZE/BANDAGES/DRESSINGS) ×1 IMPLANT
TOWEL OR 17X24 6PK STRL BLUE (TOWEL DISPOSABLE) ×2 IMPLANT
TOWEL OR 17X26 10 PK STRL BLUE (TOWEL DISPOSABLE) ×2 IMPLANT
TRAY FOLEY CATH 14FRSI W/METER (CATHETERS) ×2 IMPLANT
WATER STERILE IRR 1000ML POUR (IV SOLUTION) ×2 IMPLANT

## 2011-11-12 NOTE — Transfer of Care (Signed)
Immediate Anesthesia Transfer of Care Note  Patient: [name]  Procedure(s) Performed: Procedure(s) (LRB): POSTERIOR LUMBAR FUSION 1 LEVEL (N/A)  Patient Location: PACU  Anesthesia Type: General  Level of Consciousness: sedated  Airway & Oxygen Therapy: Patient Spontanous Breathing  Post-op Assessment: Report given to PACU RN  Post vital signs: stable  Complications: No apparent anesthesia complications

## 2011-11-12 NOTE — OR Nursing (Signed)
Upon inserting the foley catheter it was noted that patient is uncircumcised. The foreskin was swollen and it was very hard to find the meatus because of the swollen tissue.Was able to pass the catheter without any problem once was able to retract the foreskin back.Once catheter was in foreskin was pulled back over the catheter but is very swollen. Surgeon aware. DDay RN

## 2011-11-12 NOTE — Anesthesia Postprocedure Evaluation (Signed)
  Anesthesia Post-op Note  Patient: [name]  Procedure(s) Performed: Procedure(s) (LRB): POSTERIOR LUMBAR FUSION 1 LEVEL (N/A)  Patient Location: PACU  Anesthesia Type: General  Level of Consciousness: awake and alert   Airway and Oxygen Therapy: Patient Spontanous Breathing  Post-op Pain: mild  Post-op Assessment: Post-op Vital signs reviewed, Patient's Cardiovascular Status Stable, Respiratory Function Stable, Patent Airway, No signs of Nausea or vomiting and Pain level controlled  Post-op Vital Signs: stable  Complications: No apparent anesthesia complications

## 2011-11-12 NOTE — Preoperative (Signed)
Beta Blockers   Reason not to administer Beta Blockers:Atenolol 50mg  PO 0600hrs 11/12/11

## 2011-11-12 NOTE — Op Note (Signed)
Brief history: The patient is a 71 year old white male who has suffered from chronic back buttocks and leg pain consistent with neurogenic claudication. He has failed medical management and was worked up with a lumbar MRI. This demonstrated a spondylolisthesis and spinal stenosis at L4-5. I discussed the various treatment options with the patient including surgery. The patient has weighed the risks, benefits, and alternatives surgery decided proceed with an L4-5 decompression instrumentation and fusion.  Preoperative diagnosis: L4-5 spondylolisthesis, spondylolysis, Degenerative disc disease, spinal stenosis; lumbago; lumbar radiculopathy  Postoperative diagnosis: L4-5 spondylolisthesis, spondylosis, Degenerative disc disease, spinal stenosis; lumbago; lumbar radiculopathy  Procedure: L4 laminectomy/Gill procedure to decompress the bilateral L4 and L5 nerve roots(the work required to do this was in addition to the work required to do the posterior lumbar interbody fusion because of the patient's spinal stenosis, facet arthropathy. Etc. requiring a wide decompression of the nerve roots.); L4-5 posterior lumbar interbody fusion with local morselized autograft bone and Actifusebone graft extender; insertion of interbody prosthesis at L4-5 (globus peek interbody prosthesis); posterior nonsegmental instrumentation from L4 to L5 with globus titanium pedicle screws and rods; posterior lateral arthrodesis at L4-5 with local morselized autograft bone and Vitoss bone graft extender.  Surgeon: Dr. Delma Officer  Asst.: Dr. Aliene Beams  Anesthesia: Gen. endotracheal  Estimated blood loss: 200 cc  Drains: None  Locations: None  Description of procedure: The patient was brought to the operating room by the anesthesia team. General endotracheal anesthesia was induced. The patient was turned to the prone position on the Wilson frame. The patient's lumbosacral region was then prepared with Betadine scrub and  Betadine solution. Sterile drapes were applied.  I then injected the area to be incised with Marcaine with epinephrine solution. I then used the scalpel to make a linear midline incision over the L4-5 interspace. I then used electrocautery to perform a bilateral subperiosteal dissection exposing the spinous process and lamina of L3-L4 and L5. We then obtained intraoperative radiograph to confirm our location. We then inserted the Verstrac retractor to provide exposure.  I began the decompression by using the high speed drill to perform laminotomies at L4. I incised interspinous ligament at L3-4 and L4-5 with a scalpel. I used Leksell nodule were to remove the spinous process and lamina of L4 as well as the inferior facets at L4. The patient did have a spondylolisthesis at L4 bilaterally. We used a Kerrison punch to remove the ligamentum flavum at L3-4 and L4-5 decompressing the thecal sac. We then used the Kerrison punches to perform a wide laminotomy about the bilateral L4 and L5 nerve roots. This completed the decompression.  We now turned our attention to the posterior lumbar interbody fusion. I used a scalpel to incise the intervertebral disc at L4-5. I then performed a partial intervertebral discectomy at L4-5 using the pituitary forceps. We prepared the vertebral endplates at L4-5 for the fusion by removing the soft tissues with the curettes. We then used the trial spacers to pick the appropriate sized interbody prosthesis. We prefilled his prosthesis with a combination of local morselized autograft bone that we obtained during the decompression as well as Actifuse bone graft extender. We inserted the prefilled prosthesis into the interspace at L4-5. There was a good snug fit of the prosthesis in the interspace. We then filled and the remainder of the intervertebral disc space with local morselized autograft bone and Actifuse. This completed the posterior lumbar interbody arthrodesis.  We now turned  attention to the instrumentation. Under fluoroscopic guidance  we cannulated the bilateral L4 and L5 pedicles with the bone probe. We then removed the bone probe. He then tapped the pedicle with a 5.5 millimeter tap. We then removed the tap. We probed inside the tapped pedicle with a ball probe to rule out cortical breaches. We then inserted a 6.5 x 60 and 6.5 x 45 millimeter pedicle screw into the L4 and L5 pedicles bilaterally under fluoroscopic guidance. We then palpated along the medial aspect of the pedicles to rule out cortical breaches. There were none. The nerve roots were not injured. We then connected the unilateral pedicle screws with a lordotic rod. We compressed the construct and secured the rod in place with the caps. We then tightened the caps appropriately. This completed the instrumentation from L4-L5.  We now turned our attention to the posterior lateral arthrodesis at L4-5. We used the high-speed drill to decorticate the remainder of the facets, pars, transverse process at L4-5. We then applied a combination of local morselized autograft bone and Vitoss bone graft extender over these decorticated posterior lateral structures. This completed the posterior lateral arthrodesis.  We then obtained hemostasis using bipolar electrocautery. We irrigated the wound out with bacitracin solution. We inspected the thecal sac and nerve roots and noted they were well decompressed. We then removed the retractor. We reapproximated patient's thoracolumbar fascia with interrupted #1 Vicryl suture. We reapproximated patient's subcutaneous tissue with interrupted 2-0 Vicryl suture. The reapproximated patient's skin with Steri-Strips and benzoin. The wound was then coated with bacitracin ointment. A sterile dressing was applied. The drapes were removed. The patient was subsequently returned to the supine position where they were extubated by the anesthesia team. He was then transported to the post anesthesia care  unit in stable condition. All sponge instrument and needle counts were correct at the end of this case.

## 2011-11-12 NOTE — Anesthesia Preprocedure Evaluation (Addendum)
Anesthesia Evaluation  Patient identified by MRN, date of birth, ID band Patient awake    Reviewed: Allergy & Precautions, H&P , NPO status , Patient's Chart, lab work & pertinent test results, reviewed documented beta blocker date and time   Airway Mallampati: I TM Distance: >3 FB Neck ROM: Full    Dental  (+) Teeth Intact   Pulmonary  breath sounds clear to auscultation  Pulmonary exam normal       Cardiovascular hypertension, Pt. on medications and Pt. on home beta blockers + angina with exertion + CAD and + CABG Rhythm:Regular Rate:Normal     Neuro/Psych Depression    GI/Hepatic GERD-  Medicated and Controlled,  Endo/Other  Well Controlled, Type 2, Insulin Dependent and Oral Hypoglycemic Agents  Renal/GU      Musculoskeletal   Abdominal (+) + obese,   Peds  Hematology   Anesthesia Other Findings   Reproductive/Obstetrics                          Anesthesia Physical Anesthesia Plan  ASA: III  Anesthesia Plan: General   Post-op Pain Management:    Induction: Intravenous  Airway Management Planned: Oral ETT  Additional Equipment:   Intra-op Plan:   Post-operative Plan: Extubation in OR  Informed Consent: I have reviewed the patients History and Physical, chart, labs and discussed the procedure including the risks, benefits and alternatives for the proposed anesthesia with the patient or authorized representative who has indicated his/her understanding and acceptance.   Dental advisory given  Plan Discussed with: CRNA and Surgeon  Anesthesia Plan Comments:        Anesthesia Quick Evaluation

## 2011-11-12 NOTE — H&P (Signed)
Subjective: The patient is a 71 year old white male who is complaining of chronic back and leg pain. He has failed medical management and was worked up with a lumbar MRI. This demonstrated patient had a spondylolisthesis at L4-5. I discussed the various treatment options with the patient including surgery. The patient has weighed the risks, benefits, and alternatives surgery decided proceed with an L4-5 laminectomy, decompression, and fusion.   Past Medical History  Diagnosis Date  . Anginal pain   . Coronary artery disease   . Hypertension   . Depression   . Diabetes mellitus     TYPE 11   . Peripheral vascular disease     LEFT LEG  . GERD (gastroesophageal reflux disease)     OCC INDIGESTION   . Cancer     COLON, LIVER, LUNGS, LYMPH NODES  . Arthritis     Past Surgical History  Procedure Date  . Coronary artery bypass graft   . Cardiac catheterization     2008  [facility]   . Portacath placement   . Shoulder surgery     RIGHT    2 YRS AGO  [facility]   . Liver surgery   . Colectomy   . Cholecystectomy   . Eye surgery     BIL CATARACT  . Carpal tunnel release     right    Allergies  Allergen Reactions  . Amoxicillin Anaphylaxis    Stops breathing  . Lipitor (Atorvastatin) Other (See Comments)    Aches all over, really badly.    History  Substance Use Topics  . Smoking status: Not on file  . Smokeless tobacco: Not on file  . Alcohol Use: Yes     OCC BEER    History reviewed. No pertinent family history. Prior to Admission medications   Medication Sig Start Date End Date Taking? Authorizing Provider  aspirin EC 81 MG tablet Take 81 mg by mouth daily.   Yes Historical Provider, MD  atenolol (TENORMIN) 100 MG tablet Take 50 mg by mouth daily.   Yes Historical Provider, MD  enalapril (VASOTEC) 20 MG tablet Take 10 mg by mouth every morning.   Yes Historical Provider, MD  fish oil-omega-3 fatty acids 1000 MG capsule Take 1 g by mouth every morning.   Yes Historical  Provider, MD  fluvastatin XL (LESCOL XL) 80 MG 24 hr tablet Take 40 mg by mouth every morning.   Yes Historical Provider, MD  glyBURIDE (DIABETA) 5 MG tablet Take 5 mg by mouth 2 (two) times daily with a meal.   Yes Historical Provider, MD  insulin glargine (LANTUS) 100 UNIT/ML injection Inject 60 Units into the skin at bedtime.   Yes Historical Provider, MD  metFORMIN (GLUCOPHAGE) 1000 MG tablet Take 1,000 mg by mouth 2 (two) times daily with a meal.   Yes Historical Provider, MD  Multiple Vitamin (MULTIVITAMIN WITH MINERALS) TABS Take 1 tablet by mouth daily.   Yes Historical Provider, MD  nitroGLYCERIN (NITROSTAT) 0.4 MG SL tablet Place 0.4 mg under the tongue every 5 (five) minutes as needed. For chest pain   Yes Historical Provider, MD  sertraline (ZOLOFT) 50 MG tablet Take 100 mg by mouth daily.   Yes Historical Provider, MD  Tamsulosin HCl (FLOMAX) 0.4 MG CAPS Take 0.4 mg by mouth daily.   Yes Historical Provider, MD  VITAMIN E PO Take 1 tablet by mouth 2 (two) times daily.   Yes Historical Provider, MD     Review of Systems  Positive ROS: As above  All other systems have been reviewed and were otherwise negative with the exception of those mentioned in the HPI and as above.  Objective: Vital signs in last 24 hours:    General Appearance: Alert, cooperative, no distress, appears stated age Head: Normocephalic, without obvious abnormality, atraumatic Eyes: PERRL, conjunctiva/corneas clear, EOM's intact, fundi benign, both eyes      Ears: Normal TM's and external ear canals, both ears Throat: Lips, mucosa, and tongue normal; teeth and gums normal Neck: Supple, symmetrical, trachea midline, no adenopathy; thyroid: No enlargement/tenderness/nodules; no carotid bruit or JVD Back: Symmetric, no curvature, ROM normal, no CVA tenderness Lungs: Clear to auscultation bilaterally, respirations unlabored Heart: Regular rate and rhythm, S1 and S2 normal, no murmur, rub or gallop Abdomen:  Soft, non-tender, bowel sounds active all four quadrants, no masses, no organomegaly Extremities: Extremities normal, atraumatic, no cyanosis or edema Pulses: 2+ and symmetric all extremities Skin: Skin color, texture, turgor normal, no rashes or lesions  NEUROLOGIC:   Mental status: alert and oriented, no aphasia, good attention span, Fund of knowledge/ memory ok Motor Exam - grossly normal Sensory Exam - grossly normal Reflexes:  Coordination - grossly normal Gait - grossly normal Balance - grossly normal Cranial Nerves: I: smell Not tested  II: visual acuity  OS: Normal    OD: Normal   II: visual fields Full to confrontation  II: pupils Equal, round, reactive to light  III,VII: ptosis None  III,IV,VI: extraocular muscles  Full ROM  V: mastication Normal  V: facial light touch sensation  Normal  V,VII: corneal reflex  Present  VII: facial muscle function - upper  Normal  VII: facial muscle function - lower Normal  VIII: hearing Not tested  IX: soft palate elevation  Normal  IX,X: gag reflex Present  XI: trapezius strength  5/5  XI: sternocleidomastoid strength 5/5  XI: neck flexion strength  5/5  XII: tongue strength  Normal    Data Review Lab Results  Component Value Date   WBC 7.5 11/04/2011   HGB 14.1 11/04/2011   HCT 42.0 11/04/2011   MCV 86.4 11/04/2011   PLT 176 11/04/2011   Lab Results  Component Value Date   NA 137 11/04/2011   K 4.5 11/04/2011   CL 104 11/04/2011   CO2 23 11/04/2011   BUN 20 11/04/2011   CREATININE 1.04 11/04/2011   GLUCOSE 130* 11/04/2011   Lab Results  Component Value Date   INR 1.0 06/25/2008    Assessment/Plan: L4-5 spondylolisthesis, lumbago, lumbar radiculopathy, spinal stenosis: I discussed situation with the patient. I have reviewed his MR scan with them and pointed out the abnormalities. We have discussed the various treatment options including surgery. I have described the surgical option of L4-5 decompression, each patient, and  fusion. I've shown him surgical models. We have discussed the risks, benefits, alternatives and likelihood of achieving our goals with surgery. I've answered all the patient's questions. He wants to proceed with surgery.   Sriram Febles D 11/12/2011 7:29 AM

## 2011-11-13 LAB — BASIC METABOLIC PANEL
BUN: 15 mg/dL (ref 6–23)
Calcium: 8.9 mg/dL (ref 8.4–10.5)
GFR calc Af Amer: 71 mL/min — ABNORMAL LOW (ref >90)
GFR calc non Af Amer: 61 mL/min — ABNORMAL LOW (ref >90)
Potassium: 4.3 mEq/L (ref 3.5–5.1)

## 2011-11-13 LAB — CBC
HCT: 39.6 % (ref 39.0–52.0)
MCH: 28.8 pg (ref 26.0–34.0)
MCHC: 33.6 g/dL (ref 30.0–36.0)
RDW: 13.4 % (ref 11.5–15.5)

## 2011-11-13 LAB — GLUCOSE, CAPILLARY
Glucose-Capillary: 70 mg/dL (ref 70–99)
Glucose-Capillary: 142 mg/dL — ABNORMAL HIGH (ref 70–99)

## 2011-11-13 MED ORDER — OXYCODONE-ACETAMINOPHEN 10-325 MG PO TABS: 1.0000 | ORAL_TABLET | ORAL | Status: AC | PRN

## 2011-11-13 MED ORDER — DIAZEPAM 5 MG PO TABS: 5.0000 mg | ORAL_TABLET | Freq: Four times a day (QID) | ORAL | Status: AC | PRN

## 2011-11-13 MED ORDER — BIOTENE DRY MOUTH MT LIQD
15.0000 mL | Freq: Two times a day (BID) | OROMUCOSAL | Status: DC
  Administered 2011-11-14 – 2011-11-20 (×9): 15 mL via OROMUCOSAL

## 2011-11-13 MED ORDER — DSS 100 MG PO CAPS: 100.0000 mg | ORAL_CAPSULE | Freq: Two times a day (BID) | ORAL | Status: AC

## 2011-11-13 NOTE — Progress Notes (Signed)
Reviewed chart and consult. Patient will dc home with home health services per CM.  At this time no AD is wanted, thus no other interventions for CSW.  Will sign off. If needs arise, please re-consult.  Ashley Jacobs, MSW LCSW [phone]

## 2011-11-13 NOTE — Progress Notes (Signed)
Patient ID: [name], male   DOB: 08-Nov-1940, 71 y.o.   MRN: [id number] Subjective:  The patient is alert and pleasant. He looks well. His back is appropriately sore.  Objective: Vital signs in last 24 hours: Temp:  [97 F (36.1 C)-99.6 F (37.6 C)] 99.6 F (37.6 C) (08/30 0600) Pulse Rate:  [63-80] 80  (08/30 0600) Resp:  [10-21] 20  (08/30 0636) BP: (103-141)/(47-78) 138/47 mmHg (08/30 0600) SpO2:  [93 %-99 %] 94 % (08/30 0636) Weight:  [100.245 kg (221 lb)] 100.245 kg (221 lb) (08/30 0200)  Intake/Output from previous day: 08/29 0701 - 08/30 0700 In: 2500 [I.V.:2000; IV Piggyback:500] Out: 750 [Urine:550; Blood:200] Intake/Output this shift:    Physical exam the patient is alert and oriented x3. His strength is grossly normal his lower extremities. His dressing is clean and dry.  Lab Results: No results found for this basename: WBC:2,HGB:2,HCT:2,PLT:2 in the last 72 hours BMET No results found for this basename: NA:2,K:2,CL:2,CO2:2,GLUCOSE:2,BUN:2,CREATININE:2,CALCIUM:2 in the last 72 hours  Studies/Results: Dg Lumbar Spine 2-3 Views  11/12/2011  *RADIOLOGY REPORT*  Clinical Data: Back pain  DG C-ARM 1-60 MIN,LUMBAR SPINE - 2-3 VIEW  Comparison: Multiple priors.  Findings: C-arm films document L4-5 PLIF.  Moderate anterolisthesis persists. Hardware intact.  IMPRESSION: As above.   Original Report Authenticated By: Elsie Stain, M.D.    Dg Lumbar Spine 1 View  11/12/2011  *RADIOLOGY REPORT*  Clinical Data: Lumbar fusion.  LUMBAR SPINE - 1 VIEW  Comparison: Lumbar spine MR 09/24/2011.  Findings: Intraoperative cross-table lateral view of the lumbar spine, taken at 0830 hours, is submitted.  Numbering system utilized on 09/24/2011 is preserved.  A surgical instrument tip projects posterior to the L4-5 facet joints.  Grade 1 anterolisthesis of L4 on L5 with advanced degenerative disc disease.  IMPRESSION: Intraoperative localization, as above.   Original Report  Authenticated By: Reyes Ivan, M.D.    Dg C-arm 1-60 Min  11/12/2011  *RADIOLOGY REPORT*  Clinical Data: Back pain  DG C-ARM 1-60 MIN,LUMBAR SPINE - 2-3 VIEW  Comparison: Multiple priors.  Findings: C-arm films document L4-5 PLIF.  Moderate anterolisthesis persists. Hardware intact.  IMPRESSION: As above.   Original Report Authenticated By: Elsie Stain, M.D.     Assessment/Plan: Postop day #1: The patient is doing well. We will mobilize him today. His Foley has been discontinued. We will plan to DC his PCA tomorrow and send him home likely tomorrow or Sunday. I have given him his discharge instructions and answered all his questions. His prescriptions are in the chart.  LOS: 1 day     Trenda Corliss D 11/13/2011, 7:45 AM

## 2011-11-13 NOTE — Care Management Note (Signed)
    Page 1 of 1   11/23/2011     12:07:40 PM   CARE MANAGEMENT NOTE 11/23/2011  Patient:  [name], [name]   Account Number:  [id number]  Date Initiated:  11/13/2011  Documentation initiated by:  Onnie Boer  Subjective/Objective Assessment:   PT WAS ADMITTED FOR BACK SURGERY     Action/Plan:   PROGRESSION OF CARE AND DISCHARGE PLANNING   Anticipated DC Date:  11/16/2011   Anticipated DC Plan:  HOME W HOME HEALTH SERVICES      DC Planning Services  CM consult      Choice offered to / List presented to:             Status of service:  Completed, signed off Medicare Important Message given?   (If response is "NO", the following Medicare IM given date fields will be blank) Date Medicare IM given:   Date Additional Medicare IM given:    Discharge Disposition:    Per UR Regulation:  Reviewed for med. necessity/level of care/duration of stay  If discussed at Long Length of Stay Meetings, dates discussed:    Comments:  11/19/11 Onnie Boer, RN, BSN 1655 PT IS TO DC TO [facility] TOMORROW  11/13/11 Onnie Boer, RN, BSN 1234 PT WAS ADMITTED FOR A FUSION.  PTA PT WAS AT HOME WITH SELF CARE PTA.  WILL F/U ON DC NEEDS AND RECOMMENDATIONS.

## 2011-11-13 NOTE — Progress Notes (Signed)
Hypoglycemic Event  CBG: 60   Treatment: 15 GM carbohydrate snack  Symptoms: none  Follow-up CBG: Time:  0700   CBG Result: 70  Possible Reasons for Event: Other: Possible change in type of food, less Carb's  Comments/MD notified: Breakfast trays are being served, will cont to monitor.     Aracelly Tencza, Alexia Freestone  Remember to initiate Hypoglycemia Order Set & complete

## 2011-11-13 NOTE — Evaluation (Addendum)
Physical Therapy Evaluation Patient Details Name: [name] MRN: [id number] DOB: [birth year] Today's Date: 11/13/2011 Time: 0935-1006 PT Time Calculation (min): 31 min  PT Assessment / Plan / Recommendation Clinical Impression  71 y.o. male POD #1 for L4-L5 laminectomy/decompression/fusion. Pt requires mod assist for OOB, min A to walk 15'x2 with wide BOS and decreased step length. May need ST-SNF if progress is slow 2* pt lives alone and has a flight of stairs to bedroom. Pt would benefit from acute PT to maximize safety and independence with mobility.     PT Assessment  Patient needs continued PT services    Follow Up Recommendations  Home health PT;Skilled nursing facility;Supervision - Intermittent    Barriers to Discharge Decreased caregiver support;Inaccessible home environment Lives alone, neighbor can assist, bedroom is up one flight of stairs    Equipment Recommendations  None recommended by PT    Recommendations for Other Services OT consult   Frequency 7X/week    Precautions / Restrictions Precautions Precautions: Back Required Braces or Orthoses: Spinal Brace Spinal Brace: Other (comment) Spinal Brace Comments: [place] Restrictions Weight Bearing Restrictions: No   Pertinent Vitals/Pain *Pt reports 7/10 soreness in low back Premedicated with PCA SaO2 98% on 2L O2 HR 81**      Mobility  Bed Mobility Bed Mobility: Rolling Right;Right Sidelying to Sit Rolling Right: 4: Min assist;5: Set up Right Sidelying to Sit: 3: Mod assist Transfers Transfers: Sit to Stand;Stand to Sit Sit to Stand: From chair/3-in-1;From bed;With upper extremity assist Stand to Sit: To chair/3-in-1;With upper extremity assist Details for Transfer Assistance: VCs hand placement Ambulation/Gait Ambulation/Gait Assistance: 4: Min assist Ambulation Distance (Feet): 30 Feet (15' x 2 with seated rest break) Assistive device: Rolling walker Ambulation/Gait Assistance Details: min  assist for balance Gait Pattern: Decreased step length - right;Decreased step length - left;Wide base of support;Decreased stride length;Trunk flexed Gait velocity: decreased    Exercises     PT Diagnosis: Difficulty walking;Abnormality of gait;Acute pain  PT Problem List: Decreased activity tolerance;Decreased mobility;Pain;Decreased knowledge of use of DME PT Treatment Interventions: Functional mobility training;Therapeutic activities;Therapeutic exercise;Stair training;Gait training;Patient/family education   PT Goals Acute Rehab PT Goals PT Goal Formulation: With patient Time For Goal Achievement: 11/20/11 Potential to Achieve Goals: Fair Pt will Roll Supine to Right Side: Independently PT Goal: Rolling Supine to Right Side - Progress: Goal set today Pt will go Supine/Side to Sit: with HOB 0 degrees;Independently PT Goal: Supine/Side to Sit - Progress: Goal set today Pt will go Sit to Stand: with modified independence;with upper extremity assist PT Goal: Sit to Stand - Progress: Goal set today Pt will Ambulate: 51 - 150 feet;with modified independence PT Goal: Ambulate - Progress: Goal set today Pt will Go Up / Down Stairs: 6-9 stairs;with modified independence;with rail(s) PT Goal: Up/Down Stairs - Progress: Goal set today  Visit Information  Last PT Received On: 11/13/11 Assistance Needed: +1    Subjective Data  Subjective: I feel like I'm walking like a duck. Patient Stated Goal: to be more mobile   Prior Functioning  Home Living Lives With: Alone Available Help at Discharge: Neighbor Type of Home: House Home Access: Stairs to enter [employer] of Steps: 1 Home Layout: Two level;Bed/bath upstairs Alternate Level Stairs-Number of Steps: 14 Alternate Level Stairs-Rails: Right Home Adaptive Equipment: Walker - rolling;Bedside commode/3-in-1 Prior Function Level of Independence: Independent Able to Take Stairs?: Yes Vocation:  Retired Musician: No difficulties    Cognition  Overall Cognitive Status: Appears within functional  limits for tasks assessed/performed Arousal/Alertness: Awake/alert Orientation Level: Appears intact for tasks assessed Behavior During Session: [facility] for tasks performed    Extremity/Trunk Assessment Right Upper Extremity Assessment RUE ROM/Strength/Tone: [facility] for tasks assessed Left Upper Extremity Assessment LUE ROM/Strength/Tone: WFL for tasks assessed Right Lower Extremity Assessment RLE ROM/Strength/Tone: Within functional levels RLE Sensation: WFL - Light Touch RLE Coordination: WFL - gross/fine motor Left Lower Extremity Assessment LLE ROM/Strength/Tone: Within functional levels LLE Sensation: WFL - Light Touch LLE Coordination: WFL - gross/fine motor Trunk Assessment Trunk Assessment: Normal   Balance Balance Balance Assessed: Yes Static Sitting Balance Static Sitting - Balance Support: Bilateral upper extremity supported;Feet supported Static Sitting - Level of Assistance: 6: Modified independent (Device/Increase time) Static Sitting - Comment/# of Minutes: 2  End of Session PT - End of Session Equipment Utilized During Treatment: Back brace Activity Tolerance: Patient limited by pain;Patient limited by fatigue Patient left: in chair Nurse Communication: Mobility status  GP     Ralene Bathe Kistler 11/13/2011, 10:22 AM [phone]

## 2011-11-13 NOTE — Evaluation (Signed)
Occupational Therapy Evaluation Patient Details Name: [name] MRN: [id number] DOB: [birth year] Today's Date: 11/13/2011 Time: 0865-7846 OT Time Calculation (min): 57 min  OT Assessment / Plan / Recommendation Clinical Impression  This 71 y.o male admitted for lumbar fusion.  Pt moving slowly first day post op and is limited by back and chronic shoulder pain.  Pt. lives alone with very little support at home.  Recommend SNF for rehab; however, pt. is very resistant to this idea.  Pt. also with a large dog and a cat, and has no assistanc with caring for pets.  Have worked with him to problem solve through safe options, however, he appears resistant to all.  Pt. needs further acute OT to address below listed deficits to allow pt to return to maximal level of independence with BADLs to return home with min A .    OT Assessment  Patient needs continued OT Services    Follow Up Recommendations  Skilled nursing facility;Supervision/Assistance - 24 hour (Pt refusing)    Barriers to Discharge Decreased caregiver support    Equipment Recommendations  3 in 1 bedside comode;Tub/shower seat    Recommendations for Other Services    Frequency  Min 2X/week    Precautions / Restrictions Precautions Precautions: Back Precaution Comments: Pt. unable to recall any precautions.  Pt. re-instructed in back precautions Required Braces or Orthoses: Spinal Brace Spinal Brace: Lumbar corset Spinal Brace Comments: [place].  Total A to don Restrictions Weight Bearing Restrictions: No       ADL  Grooming: Wash/dry hands;Wash/dry face;Simulated;Supervision/safety Where Assessed - Grooming: Supported sitting Upper Body Bathing: Simulated;Maximal assistance Where Assessed - Upper Body Bathing: Supported sitting Lower Body Bathing: Simulated;+1 Total assistance Where Assessed - Lower Body Bathing: Supported sit to stand Upper Body Dressing: Simulated;Maximal assistance Where Assessed - Upper Body  Dressing: Supported sitting Lower Body Dressing: Simulated;+1 Total assistance Where Assessed - Lower Body Dressing: Supported sit to Pharmacist, hospital: Mining engineer Method: Sit to Barista: Bedside commode Toileting - Clothing Manipulation and Hygiene: Simulated;Maximal assistance Where Assessed - Engineer, mining and Hygiene: Standing Equipment Used: Back brace;Rolling walker Transfers/Ambulation Related to ADLs: Pt. requires increased time to complete all tasks.  Min A for ambulation short distances in room.  Pt. with one LOB posteriorly requiring mod A to recover ADL Comments: Pt. moving very slowly.  C/o pain 6-10 Lt. shoulder (chronic) and back).  Discussed recommendation for possible SNF with pt  He is adamant that he will go home.   Long discussion with him re: realistic expectations.  Pt. reports he has a dog and upon further questioning, has not thought through how he is going to provide care for dog without compromising back.  Discussed options for feeding (having someone move food to counter height, placing bowl on a box to make it easier to reach, using reacher to retrieve bowl, etc.  Pt. reports he plans on walking dog as he does not have fenced in yard.  Discussed significant fall risk if pt. attempts this.  He verbalizes understanding, but is resistant to solutions (kenneling dog, hiring or asking for assistance, etc)    OT Diagnosis: Generalized weakness;Acute pain  OT Problem List: Decreased strength;Decreased activity tolerance;Impaired balance (sitting and/or standing);Decreased safety awareness;Decreased knowledge of use of DME or AE;Decreased knowledge of precautions;Obesity;Impaired UE functional use;Pain OT Treatment Interventions: Self-care/ADL training;DME and/or AE instruction;Therapeutic activities;Patient/family education;Balance training   OT Goals Acute Rehab OT Goals OT Goal Formulation: With  patient  Time For Goal Achievement: 11/20/11 Potential to Achieve Goals: Good ADL Goals Pt Will Perform Grooming: with supervision;Standing at sink ADL Goal: Grooming - Progress: Goal set today Pt Will Perform Lower Body Bathing: with supervision;Sit to stand from chair;Sit to stand from bed (with adaptive equipment) ADL Goal: Lower Body Bathing - Progress: Goal set today Pt Will Perform Lower Body Dressing: with supervision;with adaptive equipment;Sit to stand from chair;Sit to stand from bed ADL Goal: Lower Body Dressing - Progress: Goal set today Pt Will Transfer to Toilet: with supervision;Ambulation;Comfort height toilet;3-in-1 ADL Goal: Toilet Transfer - Progress: Goal set today Pt Will Perform Toileting - Clothing Manipulation: with supervision;Standing ADL Goal: Toileting - Clothing Manipulation - Progress: Goal set today Pt Will Perform Toileting - Hygiene: with supervision;with adaptive equipment;Sit to stand from 3-in-1/toilet ADL Goal: Toileting - Hygiene - Progress: Goal set today Pt Will Perform Tub/Shower Transfer: with min assist;with DME;Ambulation;Tub transfer ADL Goal: Tub/Shower Transfer - Progress: Goal set today Additional ADL Goal #1: Pt. will be independent with donning/doffing brace ADL Goal: Additional Goal #1 - Progress: Goal set today Additional ADL Goal #2: Pt. will verbalize safe options for pet care  Visit Information  Last OT Received On: 11/13/11 Assistance Needed: +1    Subjective Data  Subjective: "I'll just have to try it out first and then I will figure it out"  "No rehab" Patient Stated Goal: To go home   Prior Functioning  Vision/Perception  Home Living Lives With: Alone Available Help at Discharge: Neighbor Type of Home: House Home Access: Stairs to enter [employer] of Steps: 1 Home Layout: Two level;Bed/bath upstairs;1/2 bath on main level Alternate Level Stairs-Number of Steps: 14 Alternate Level Stairs-Rails:  Right Bathroom Shower/Tub: Tub/shower unit;Curtain Bathroom Toilet: Standard Bathroom Accessibility: No Home Adaptive Equipment: Walker - rolling;Bedside commode/3-in-1 Additional Comments: Pt has a dog and a cat that he is responsible for care - he insists that he has no supports to assist with this Prior Function Level of Independence: Independent Able to Take Stairs?: Yes Driving: Yes Vocation: Retired Musician: No difficulties Dominant Hand: Right      Cognition  Overall Cognitive Status: Impaired (likely due to pain medications) Area of Impairment: Attention;Safety/judgement;Awareness of errors;Awareness of deficits;Problem solving Orientation Level: Appears intact for tasks assessed Behavior During Session: [facility] for tasks performed Current Attention Level: Sustained Safety/Judgement: Decreased awareness of safety precautions;Decreased safety judgement for tasks assessed Awareness of Errors: Assistance required to identify errors made;Assistance required to correct errors made Problem Solving: mod verbal cues Cognition - Other Comments: Cognition likely impaired due to pain meds    Extremity/Trunk Assessment Right Upper Extremity Assessment RUE ROM/Strength/Tone: WFL for tasks assessed RUE Coordination: WFL - gross/fine motor Left Upper Extremity Assessment LUE ROM/Strength/Tone: Deficits LUE ROM/Strength/Tone Deficits: Pt. reports long standing Lt. shoulder issue/pain.  Pt. with very limited AROM shoulder ~20 degrees.  AAROM to ~100 elevation.  Elbow distally [facility]   Mobility  Shoulder Instructions  Bed Mobility Bed Mobility: Rolling Left;Left Sidelying to Sit;Sitting - Scoot to Edge of Bed Rolling Left: 2: Max assist;With rail Left Sidelying to Sit: 2: Max assist;With rails Sitting - Scoot to [place] of Bed: 3: Mod assist Details for Bed Mobility Assistance: Pt. moves very slowly - increased time to initiate task.  Pt requires mod verbal cues for back  precautions.  Assist needed for legs and shoulders Transfers Transfers: Sit to Stand;Stand to Sit Sit to Stand: 4: Min assist;With upper extremity assist;From bed Stand to Sit: 4: Min assist;With  upper extremity assist;To chair/3-in-1 Details for Transfer Assistance: Verbal cues for hand placement and requires increased time       Exercise     Balance Static Sitting Balance Static Sitting - Level of Assistance: 3: Mod assist;4: Min assist (variable during session.  Pt. leans posteriorly)   End of Session OT - End of Session Equipment Utilized During Treatment: Back brace Activity Tolerance: Patient limited by fatigue;Patient limited by pain Patient left: in chair;with call bell/phone within reach  GO     Rowyn Spilde M 11/13/2011, 3:23 PM

## 2011-11-13 NOTE — Progress Notes (Signed)
Inpatient Diabetes Program Recommendations  AACE/ADA: New Consensus Statement on Inpatient Glycemic Control (2013)  Target Ranges:  Prepandial:   less than 140 mg/dL      Peak postprandial:   less than 180 mg/dL (1-2 hours)      Critically ill patients:  140 - 180 mg/dL   Results for NEIKO, TRIVEDI (MRN [id number]) as of 11/13/2011 11:02  Ref. Range 11/13/2011 06:29  Glucose-Capillary Latest Range: 70-99 mg/dL 60 (L)   Inpatient Diabetes Program Recommendations Insulin - Basal: Please decrease Lantus to 55 units QHS if patient continues to have hypoglycemia in the morning.  Note: Will follow. Ambrose Finland RN, MSN, CDE Diabetes Coordinator Inpatient Diabetes Program [phone]

## 2011-11-14 LAB — GLUCOSE, CAPILLARY
Glucose-Capillary: 114 mg/dL — ABNORMAL HIGH (ref 70–99)
Glucose-Capillary: 136 mg/dL — ABNORMAL HIGH (ref 70–99)
Glucose-Capillary: 62 mg/dL — ABNORMAL LOW (ref 70–99)

## 2011-11-14 NOTE — Progress Notes (Signed)
Occupational Therapy Treatment Patient Details Name: [name] MRN: [id number] DOB: 04/22/1940 Today's Date: 11/14/2011 Time: 4098-1191 OT Time Calculation (min): 56 min  OT Assessment / Plan / Recommendation Comments on Treatment Session PT IS NOT READY FOR D/C HOME and is demonstrating some insight today to deficits. But states "ill see what I can do tomorrow It will be better' pt agreeable this session to leaving animals kenneled until Sept 4th. Pt acknowledged deficits with bed mobility however changed the subject when asked to address dischange on x5 different points in session. Pt in denial of deficits and should not at this time d/c home. Pt is not safe    Follow Up Recommendations  Skilled nursing facility;Supervision/Assistance - 24 hour    Barriers to Discharge       Equipment Recommendations  Defer to next venue    Recommendations for Other Services    Frequency Min 2X/week   Plan Discharge plan remains appropriate    Precautions / Restrictions Precautions Precautions: Back Precaution Comments: Pt. unable to recall any precautions.  Pt. re-instructed in back precautions Required Braces or Orthoses: Spinal Brace Spinal Brace: Lumbar corset Spinal Brace Comments: Pt with poor recall of don/ doff brace and when to wear brace. WHen educated pt states "oh yeah I know " then patient will make a joke of the information he could not recall   Pertinent Vitals/Pain 6 out 10 "oh that hurts to get out of bed"    ADL  Grooming: Performed;Minimal assistance Where Assessed - Grooming: Supported standing Upper Body Dressing: Performed;+1 Total assistance (don brace) Where Assessed - Upper Body Dressing: Supported sitting Toilet Transfer: Performed;Maximal Dentist Method: Sit to Barista: Raised toilet seat with arms (or 3-in-1 over toilet) Toileting - Clothing Manipulation and Hygiene: Performed;Maximal assistance Where Assessed -  Glass blower/designer Manipulation and Hygiene: Sit to stand from 3-in-1 or toilet Equipment Used: Back brace;Gait belt;Rolling walker Transfers/Ambulation Related to ADLs: Pt allowed 8 minutes to attempt EOB transfer. Pt unable to even roll onto Lt side. Pt chose Lt side stating it woudl be easier. Pt states "ill be here all day if I have to do this. I better keep my cell phone close' Pt required 6 minutes for sit<>stand attempting to grab multiple unsafe items in environment with max correction from OT. Pt can not perform transfers safely alone . Pt must be MOD I to d/c home and pt is NOT AT ADEQUATE LEVEL ADL Comments: Pt with visitors on arrival. Pt allowed 20 extra minutes with visitors prior to OT returning.     OT Diagnosis:    OT Problem List:   OT Treatment Interventions:     OT Goals Acute Rehab OT Goals OT Goal Formulation: With patient Time For Goal Achievement: 11/20/11 Potential to Achieve Goals: Good ADL Goals Pt Will Perform Grooming: with supervision;Standing at sink ADL Goal: Grooming - Progress: Progressing toward goals Pt Will Perform Lower Body Bathing: with supervision;Sit to stand from chair;Sit to stand from bed Pt Will Perform Lower Body Dressing: with supervision;with adaptive equipment;Sit to stand from chair;Sit to stand from bed Pt Will Transfer to Toilet: with supervision;Ambulation;Comfort height toilet;3-in-1 ADL Goal: Toilet Transfer - Progress: Progressing toward goals Pt Will Perform Toileting - Clothing Manipulation: with supervision;Standing ADL Goal: Toileting - Clothing Manipulation - Progress: Progressing toward goals Pt Will Perform Toileting - Hygiene: with supervision;with adaptive equipment;Sit to stand from 3-in-1/toilet ADL Goal: Toileting - Hygiene - Progress: Progressing toward goals Pt Will Perform  Tub/Shower Transfer: with min assist;with DME;Ambulation;Tub transfer Additional ADL Goal #1: Pt. will be independent with donning/doffing brace ADL  Goal: Additional Goal #1 - Progress: Not met Additional ADL Goal #2: Pt. will verbalize safe options for pet care  Visit Information  Last OT Received On: 11/14/11 Assistance Needed: +1    Subjective Data      Prior Functioning       Cognition  Overall Cognitive Status: Impaired Arousal/Alertness: Awake/alert Behavior During Session: [facility] for tasks performed Current Attention Level: Sustained Memory: Decreased recall of precautions Memory Deficits: Unable to recall back precautions or technique for sit to stand. Safety/Judgement: Decreased awareness of safety precautions;Decreased safety judgement for tasks assessed Awareness of Errors: Assistance required to identify errors made;Assistance required to correct errors made Problem Solving: mod verbal cues    Mobility  Shoulder Instructions Bed Mobility Rolling Left: 2: Max assist Left Sidelying to Sit: 2: Max assist;With rails Sitting - Scoot to Edge of Bed: 2: Max assist Details for Bed Mobility Assistance: Pt provided v/c to log roll to the Lt side. Pt said okay just give me a second Ill do it after 10 minutes of patient trying pt finally admitted that he needed help. Pt states "it woudl take me all day if I did this at home I better keep my cell phone" Transfers Sit to Stand: 4: Min assist Stand to Sit: 4: Min assist Details for Transfer Assistance: Pt pulling on RW, pulling on chair beside bed, rocking on eob, pulling at rail on bed and unsuccessful attempts x 8 minutes. Ot providing v/c and mod v/c for hand placement Pt finally able to stand using rocking momentum to complete transfer       Exercises      Balance     End of Session OT - End of Session Equipment Utilized During Treatment: Back brace Activity Tolerance: Patient limited by fatigue;Patient limited by pain Patient left: in bed;with call bell/phone within reach Nurse Communication: Mobility status  GO     Lucile Shutters 11/14/2011, 4:53  PM Pager: [phone]

## 2011-11-14 NOTE — Progress Notes (Signed)
Physical Therapy Treatment Patient Details Name: [name] MRN: [id number] DOB: 1940/05/19 Today's Date: 11/14/2011 Time: 0931-1004 PT Time Calculation (min): 33 min  PT Assessment / Plan / Recommendation Comments on Treatment Session  Patient requires increased time for all mobility.  Continues to have decreased cognition, impacting mobility.  Unable to recall back precautions and needs total assist for back brace.  Patient making slow progress.  Patient lives alone - recommend ST-SNF for continued therapy at discharge.    Follow Up Recommendations  Skilled nursing facility    Barriers to Discharge        Equipment Recommendations  None recommended by PT    Recommendations for Other Services    Frequency 7X/week   Plan Discharge plan needs to be updated;Frequency remains appropriate;Equipment recommendations need to be updated    Precautions / Restrictions Precautions Precautions: Back Precaution Comments: Pt. unable to recall any precautions.  Pt. re-instructed in back precautions Required Braces or Orthoses: Spinal Brace Spinal Brace: Lumbar corset Spinal Brace Comments: Patient sitting in chair without brace.  Applied brace - required total assist. Restrictions Weight Bearing Restrictions: No       Mobility  Bed Mobility Bed Mobility: Sit to Sidelying Right;Rolling Left Rolling Left: 3: Mod assist;With rail Sit to Sidelying Right: 3: Mod assist;With rail;HOB flat Details for Bed Mobility Assistance: Verbal and tactile cues for technique and to maintain precautions.  Increased time for mobility.  Assist to move LE's onto bed. Transfers Transfers: Sit to Stand;Stand to Sit Sit to Stand: 3: Mod assist;With upper extremity assist;With armrests;From chair/3-in-1 Stand to Sit: 4: Min assist;With upper extremity assist;To bed Details for Transfer Assistance: Verbal cues for hand placement - needed to repeat cues multiple times.  Mod assist to scoot to edge of chair and  to initiate sit to stand from low chair. Ambulation/Gait Ambulation/Gait Assistance: 4: Min assist Ambulation Distance (Feet): 18 Feet Assistive device: Rolling walker Ambulation/Gait Assistance Details: Verbal cues for safe use of RW. Decreased balance, requiring min assist for safety.  Cues to stay close to RW - tending to push RW too far forward. Gait Pattern: Step-through pattern;Decreased step length - right;Decreased step length - left;Shuffle Gait velocity: Very slow.   General Gait Details: Patient takes several short steps and stops.  Needs verbal cues to initiate gait again.  Increased time for gait. Stairs: No      PT Goals Acute Rehab PT Goals PT Goal: Rolling Supine to Right Side - Progress: Progressing toward goal PT Goal: Supine/Side to Sit - Progress: Progressing toward goal PT Goal: Sit to Stand - Progress: Progressing toward goal PT Goal: Ambulate - Progress: Progressing toward goal  Visit Information  Last PT Received On: 11/14/11 Assistance Needed: +1    Subjective Data  Subjective: "I've been up since lunch."  Patient reoriented to time (9:30 am) and opened shade over window.   Cognition  Overall Cognitive Status: Impaired Area of Impairment: Memory;Attention;Safety/judgement;Awareness of deficits;Problem solving Arousal/Alertness: Awake/alert Orientation Level: Disoriented to;Time Behavior During Session: WFL for tasks performed Current Attention Level: Sustained Memory: Decreased recall of precautions Memory Deficits: Unable to recall back precautions or technique for sit to stand. Safety/Judgement: Decreased awareness of safety precautions;Decreased safety judgement for tasks assessed    Balance     End of Session PT - End of Session Equipment Utilized During Treatment: Gait belt;Back brace;Oxygen Activity Tolerance: Patient limited by fatigue;Patient limited by pain Patient left: in bed;with call bell/phone within reach Nurse Communication:  Mobility status  GP     Vena Austria 11/14/2011, 10:22 AM Durenda Hurt. Renaldo Fiddler, [facility] Acute Rehab Services Pager [phone]

## 2011-11-14 NOTE — Progress Notes (Signed)
Subjective: Patient reports doing better, still quite sore.  Objective: Vital signs in last 24 hours: Temp:  [98.3 F (36.8 C)-100.7 F (38.2 C)] 100.2 F (37.9 C) (08/31 0933) Pulse Rate:  [75-86] 86  (08/31 0933) Resp:  [18-20] 20  (08/31 0933) BP: (95-116)/(46-58) 98/46 mmHg (08/31 0933) SpO2:  [94 %-98 %] 96 % (08/31 0514)  Intake/Output from previous day: 08/30 0701 - 08/31 0700 In: -  Out: 1200 [Urine:1200] Intake/Output this shift:    Physical Exam: Doing well.  Up in brace  Lab Results:  Basename 11/13/11 0800  WBC 12.5*  HGB 13.3  HCT 39.6  PLT 176   BMET  Basename 11/13/11 0800  NA 133*  K 4.3  CL 98  CO2 23  GLUCOSE 135*  BUN 15  CREATININE 1.18  CALCIUM 8.9    Studies/Results: Dg Lumbar Spine 2-3 Views  11/12/2011  *RADIOLOGY REPORT*  Clinical Data: Back pain  DG C-ARM 1-60 MIN,LUMBAR SPINE - 2-3 VIEW  Comparison: Multiple priors.  Findings: C-arm films document L4-5 PLIF.  Moderate anterolisthesis persists. Hardware intact.  IMPRESSION: As above.   Original Report Authenticated By: Elsie Stain, M.D.    Dg Lumbar Spine 1 View  11/12/2011  *RADIOLOGY REPORT*  Clinical Data: Lumbar fusion.  LUMBAR SPINE - 1 VIEW  Comparison: Lumbar spine MR 09/24/2011.  Findings: Intraoperative cross-table lateral view of the lumbar spine, taken at 0830 hours, is submitted.  Numbering system utilized on 09/24/2011 is preserved.  A surgical instrument tip projects posterior to the L4-5 facet joints.  Grade 1 anterolisthesis of L4 on L5 with advanced degenerative disc disease.  IMPRESSION: Intraoperative localization, as above.   Original Report Authenticated By: Reyes Ivan, M.D.    Dg C-arm 1-60 Min  11/12/2011  *RADIOLOGY REPORT*  Clinical Data: Back pain  DG C-ARM 1-60 MIN,LUMBAR SPINE - 2-3 VIEW  Comparison: Multiple priors.  Findings: C-arm films document L4-5 PLIF.  Moderate anterolisthesis persists. Hardware intact.  IMPRESSION: As above.   Original Report  Authenticated By: Elsie Stain, M.D.     Assessment/Plan: D/C PCA.  Continue to mobilize.    LOS: 2 days    Dorian Heckle, MD 11/14/2011, 11:54 AM

## 2011-11-15 LAB — GLUCOSE, CAPILLARY
Glucose-Capillary: 115 mg/dL — ABNORMAL HIGH (ref 70–99)
Glucose-Capillary: 90 mg/dL (ref 70–99)
Glucose-Capillary: 68 mg/dL — ABNORMAL LOW (ref 70–99)

## 2011-11-15 NOTE — Progress Notes (Signed)
Occupational Therapy Treatment Patient Details Name: [name] MRN: [id number] DOB: June 09, 1940 Today's Date: 11/15/2011 Time: 1735-1759 OT Time Calculation (min): 24 min  OT Assessment / Plan / Recommendation Comments on Treatment Session Pt is not ready for D/C tomorrow.  Pt is Max A with ADL and bed mobility. Pt refusing SNF at this time, but is unsafe to return home alone at this time. will continue tomorrow with AE for ADL    Follow Up Recommendations  Skilled nursing facility;Supervision/Assistance - 24 hour    Barriers to Discharge       Equipment Recommendations  Defer to next venue    Recommendations for Other Services    Frequency Min 2X/week   Plan Discharge plan remains appropriate    Precautions / Restrictions Precautions Precautions: Back Precaution Comments: able to recall 2/3 back precautions Required Braces or Orthoses: Spinal Brace Spinal Brace: Lumbar corset;Applied in sitting position Spinal Brace Comments: assistance to doff brace   Pertinent Vitals/Pain 5    ADL  Lower Body Dressing: Performed;Maximal assistance Transfers/Ambulation Related to ADLs: refused to ambulate ADL Comments: When asked about what pt was going to do at home after seeing how much difficulty he has with ADL, pt stated he would just walk around naked because he did not know how he was going to get dressed and follow back precautions. Discussed AE. Discussed need to go to SNF due to self care issues. Pt refusing.    OT Diagnosis:    OT Problem List:   OT Treatment Interventions:     OT Goals Acute Rehab OT Goals OT Goal Formulation: With patient Time For Goal Achievement: 11/20/11 Potential to Achieve Goals: Good ADL Goals Pt Will Perform Lower Body Bathing: with supervision;Sit to stand from chair;Sit to stand from bed ADL Goal: Lower Body Bathing - Progress: Not progressing Pt Will Perform Lower Body Dressing: with supervision;with adaptive equipment;Sit to stand from  chair;Sit to stand from bed ADL Goal: Lower Body Dressing - Progress: Not progressing Additional ADL Goal #1: Pt. will be independent with donning/doffing brace ADL Goal: Additional Goal #1 - Progress: Progressing toward goals Additional ADL Goal #2: Pt. will verbalize safe options for pet care  Visit Information  Last OT Received On: 11/15/11 Assistance Needed: +1    Subjective Data      Prior Functioning       Cognition  Overall Cognitive Status: No family/caregiver present to determine baseline cognitive functioning Area of Impairment: Memory;Safety/judgement Arousal/Alertness: Awake/alert Orientation Level: Appears intact for tasks assessed Behavior During Session: [facility] for tasks performed Memory: Decreased recall of precautions Memory Deficits: unable to recallOT session from yesterday? Safety/Judgement: Decreased awareness of safety precautions;Decreased safety judgement for tasks assessed Cognition - Other Comments: poor insight into D/C needs    Mobility  Shoulder Instructions Bed Mobility Bed Mobility: Sit to Supine Sit to Supine: 2: Max assist;HOB flat;Other (comment) ( A to lift both legs back onto bed) Transfers Transfers: Sit to Stand;Stand to Sit Sit to Stand: 4: Min assist;With upper extremity assist;From bed;With armrests;From chair/3-in-1 Details for Transfer Assistance: Verbal cues for hand placement and proper technique to maintain precautions. (increased time for sit - stand)       Exercises      Balance     End of Session OT - End of Session Equipment Utilized During Treatment: Back brace Activity Tolerance: Other (comment) (?self limiting) Patient left: in bed;with call bell/phone within reach Nurse Communication: Other (comment) (D/C concerns)  GO  Mercer Peifer,HILLARY 11/15/2011, 6:15 PM [facility], OTR/L  [phone] 11/15/2011

## 2011-11-15 NOTE — Progress Notes (Signed)
Filed Vitals:   11/14/11 2219 11/15/11 0200 11/15/11 0515 11/15/11 0920  BP: 126/51 123/54 118/56 120/55  Pulse: 83 84 86 80  Temp: 98.8 F (37.1 C) 98.8 F (37.1 C) 98.6 F (37 C) 100.1 F (37.8 C)  TempSrc: Oral Oral  Oral  Resp: 22 20 18 20   Height:      Weight:      SpO2: 95% 94% 96% 95%    Patient resting in bed. Has been out of bed to bathroom, but has not ambulated in the halls. Very limited mobility and ambulation. Dressing clean and dry, we'll have nursing staff remove. Encouraged to increase ambulation. Continuing PT and OT.  Plan: Continues support through postoperative recovery.  Hewitt Shorts, MD 11/15/2011, 10:21 AM

## 2011-11-15 NOTE — Progress Notes (Signed)
Physical Therapy Treatment Patient Details Name: [name] MRN: [id number] DOB: [birth year] Today's Date: 11/15/2011 Time: 4098-1191 PT Time Calculation (min): 32 min  PT Assessment / Plan / Recommendation Comments on Treatment Session  Patient with improved cognition today.  Did better with bed mobility and activity tolerance.  Continues to require assist to don/doff back brace and gait remains unsteady.  Patient lives alone - continue to recommend ST-SNF for continued therapy at discharge.    Follow Up Recommendations  Skilled nursing facility    Barriers to Discharge        Equipment Recommendations  Defer to next venue    Recommendations for Other Services    Frequency 7X/week   Plan Discharge plan remains appropriate;Frequency remains appropriate    Precautions / Restrictions Precautions Precautions: Back Precaution Comments: Patient unable to recall back precautions.  Reviewed with patient. Required Braces or Orthoses: Spinal Brace Spinal Brace: Lumbar corset;Applied in sitting position Spinal Brace Comments: Reviewed with patient process for donning brace. Required mod assist. Restrictions Weight Bearing Restrictions: No       Mobility  Bed Mobility Bed Mobility: Rolling Left;Left Sidelying to Sit;Sit to Sidelying Left Rolling Left: 4: Min assist;With rail Left Sidelying to Sit: 4: Min assist;With rails;HOB flat Sit to Sidelying Left: 3: Mod assist;HOB flat Details for Bed Mobility Assistance: Instructed patient on log rolling.  Assist with balance when coming to sitting.  Required assist to bring LE's onto bed. Transfers Transfers: Sit to Stand;Stand to Sit Sit to Stand: 4: Min assist;With upper extremity assist;From bed;With armrests;From chair/3-in-1 Stand to Sit: 4: Min assist;With upper extremity assist;With armrests;To chair/3-in-1 Details for Transfer Assistance: Verbal cues for hand placement and proper technique to maintain  precautions. Ambulation/Gait Ambulation/Gait Assistance: 4: Min assist Ambulation Distance (Feet): 140 Feet Assistive device: Rolling walker Ambulation/Gait Assistance Details: Patient taking very short shuffling steps.  Encouraged patient to take longer, slower steps during gait.  Able to maneuver RW safely.  Cues to stand upright and look forward during gait. Gait Pattern: Step-through pattern;Decreased stride length;Shuffle Gait velocity: Slow gait speed, but closer to [facility] than yesterday. Stairs: No      PT Goals Acute Rehab PT Goals PT Goal: Rolling Supine to Right Side - Progress: Progressing toward goal PT Goal: Supine/Side to Sit - Progress: Progressing toward goal PT Goal: Sit to Stand - Progress: Progressing toward goal PT Goal: Ambulate - Progress: Progressing toward goal  Visit Information  Last PT Received On: 11/15/11 Assistance Needed: +1    Subjective Data  Subjective: "I don't remember yesterday"  "I know I'm not able to go home yet.   Cognition  Overall Cognitive Status: Impaired Area of Impairment: Memory Arousal/Alertness: Awake/alert Orientation Level: Appears intact for tasks assessed Behavior During Session: [facility] for tasks performed Memory: Decreased recall of precautions Memory Deficits: Unable to recall PT session from yesterday - ? med related. Cognition - Other Comments: Much more alert today.    Balance     End of Session PT - End of Session Equipment Utilized During Treatment: Gait belt;Back brace Activity Tolerance: Patient tolerated treatment well Patient left: in bed;with call bell/phone within reach Nurse Communication: Mobility status   GP     Vena Austria 11/15/2011, 2:06 PM Durenda Hurt. Renaldo Fiddler, [facility] Acute Rehab Services Pager [phone]

## 2011-11-15 NOTE — Progress Notes (Signed)
Pt's PCA has been dc'd on day shift, however pump with syringe was still in the room when this RN came on shift. 7 ml of morphine wasted from PCA, witnessed by charge nurse Peterson Lombard.

## 2011-11-16 LAB — GLUCOSE, CAPILLARY
Glucose-Capillary: 106 mg/dL — ABNORMAL HIGH (ref 70–99)
Glucose-Capillary: 96 mg/dL (ref 70–99)

## 2011-11-16 MED FILL — Sodium Chloride IV Soln 0.9%: INTRAVENOUS | Qty: 1000 | Status: AC

## 2011-11-16 NOTE — Progress Notes (Signed)
Pt has transient confusion and reports visual hallucinations after taking hydrocodone/Apap for pain; "spiders on ceiling", "three bugs on wall". No reports of hallucinations with oxycodone/Apap previously often taken.

## 2011-11-16 NOTE — Progress Notes (Signed)
Pt had CBG's 59 this am, 15 mg carbohydrate snack given , on rechek cbg's 106. Pt was asymptomatic, day shift RN advised to monitor pt closely.

## 2011-11-16 NOTE — Progress Notes (Signed)
Physical Therapy Treatment Patient Details Name: [name] MRN: [id number] DOB: 1940/08/24 Today's Date: 11/16/2011 Time: 4098-1191 PT Time Calculation (min): 24 min  PT Assessment / Plan / Recommendation Comments on Treatment Session  Patient progressing well. Patient still requring lots of cues to increase step length but overall is making progress. Patient stated that he does not want to go to a nursing home and that he would return home with assistance from his neighbor. Will update POC    Follow Up Recommendations  Home health PT;Supervision - Intermittent    Barriers to Discharge        Equipment Recommendations  None recommended by PT    Recommendations for Other Services    Frequency Min 5X/week   Plan Discharge plan needs to be updated;Frequency needs to be updated    Precautions / Restrictions Precautions Precautions: Back Precaution Comments: Patient to recall precautions Restrictions Weight Bearing Restrictions: No   Pertinent Vitals/Pain     Mobility  Bed Mobility Rolling Left: 4: Min guard;With rail Left Sidelying to Sit: 4: Min assist;With rails Details for Bed Mobility Assistance: A to lift shoulders into upright positioning. Cues for technique Transfers Sit to Stand: 4: Min guard;From chair/3-in-1;From bed;With upper extremity assist Stand to Sit: To chair/3-in-1;4: Min guard;With upper extremity assist Details for Transfer Assistance: Cues for safe hand placement Ambulation/Gait Ambulation/Gait Assistance: 4: Min guard Ambulation Distance (Feet): 200 Feet Assistive device: Rolling walker Ambulation/Gait Assistance Details: Max cues throughout to increase step length. Cues to look up and stand tall.  Gait Pattern: Step-through pattern;Decreased stride length;Shuffle    Exercises     PT Diagnosis:    PT Problem List:   PT Treatment Interventions:     PT Goals Acute Rehab PT Goals PT Goal: Supine/Side to Sit - Progress: Progressing toward  goal PT Goal: Sit to Stand - Progress: Progressing toward goal PT Goal: Ambulate - Progress: Progressing toward goal  Visit Information  Last PT Received On: 11/16/11 Assistance Needed: +1    Subjective Data      Cognition  Overall Cognitive Status: Appears within functional limits for tasks assessed/performed Arousal/Alertness: Awake/alert Orientation Level: Appears intact for tasks assessed Behavior During Session: [facility] for tasks performed    Balance     End of Session PT - End of Session Equipment Utilized During Treatment: Gait belt;Back brace Activity Tolerance: Patient tolerated treatment well Patient left: in chair;with call bell/phone within reach Nurse Communication: Mobility status   GP     Fredrich Birks 11/16/2011, 11:55 AM 11/16/2011 Fredrich Birks PTA [phone] pager [phone] office

## 2011-11-16 NOTE — Progress Notes (Signed)
Filed Vitals:   11/15/11 2134 11/16/11 0200 11/16/11 0600 11/16/11 1005  BP: 102/56 107/52 103/57 122/57  Pulse: 81 77 83 80  Temp: 99 F (37.2 C) 99.7 F (37.6 C) 97.8 F (36.6 C) 98.4 F (36.9 C)  TempSrc: Oral Oral Oral Oral  Resp: 20 20 20 18   Height:      Weight:      SpO2: 92% 96% 97% 93%    Progress remains slow. Patient has only ambulated once. Wound is clean and dry, no erythema, swelling, or drainage. Continue to work with PT and OT. Encouraged patient to increase ambulation in halls.  Plan: Continue to progress through postoperative recovery.  Hewitt Shorts, MD 11/16/2011, 10:58 AM

## 2011-11-16 NOTE — Progress Notes (Signed)
Occupational Therapy Treatment Patient Details Name: [name] MRN: [id number] DOB: Aug 09, 1940 Today's Date: 11/16/2011 Time: 7846-9629 OT Time Calculation (min): 53 min  OT Assessment / Plan / Recommendation Comments on Treatment Session Pt with hip pain this session limiting mobility. Pt with LOB x 1 when backing up which required assist to correct. Pt is NOT SAFE TO RETURN HOME and will no acknowledge this even after LOB during session. Continue to recommend rehab. Please order CIR consult    Follow Up Recommendations  Inpatient Rehab    Barriers to Discharge       Equipment Recommendations  3 in 1 bedside comode;Tub/shower bench    Recommendations for Other Services Rehab consult  Frequency     Plan Discharge plan remains appropriate    Precautions / Restrictions Precautions Precautions: Back Precaution Comments: patient required vc to recall 2/3 back precautions and assist throughout session to maintain during functional activities Spinal Brace: Lumbar corset;Applied in sitting position Spinal Brace Comments: Min A to don brace Restrictions Weight Bearing Restrictions: No   Pertinent Vitals/Pain Pt reports 9/10 back pain- premedicated. Pt also repositioned    ADL  Grooming: Performed;Min guard Where Assessed - Grooming: Unsupported standing (occasional support on sink) Toilet Transfer: Performed;Min guard Statistician Method: Sit to Barista: Raised toilet seat with arms (or 3-in-1 over toilet) (Assist for hand placement and sequencing) Toileting - Clothing Manipulation and Hygiene: Performed;Minimal assistance Where Assessed - Toileting Clothing Manipulation and Hygiene: Sit to stand from 3-in-1 or toilet Transfers/Ambulation Related to ADLs: Pt Min guard to Min A with RW ambulation. Short, shuffling steps. Able to incr stride length for 1 to 2 steps when given cues, but quickly reverts back to small steps. Pt with LOB backwards when  backing up and required assist to regain balance. ADL Comments: Pt educated on AE use and where to purchase. Pt concerned that [employer] not cover equipment. Discussed at length pt's safety at home. DO NOT FEEL PT SAFE TO RETURN HOME however, pt adamantly declining SNF. Pt to sleep in recliner chair (encouraged pillows for support) and plans to ambulate to/from bathroom as needed. Pt is hoping that neighbor will provide assist as she did last time but has no guarantee of this. Strongly encouraged SNF.     OT Diagnosis:    OT Problem List:   OT Treatment Interventions:     OT Goals ADL Goals ADL Goal: Grooming - Progress: Progressing toward goals ADL Goal: Lower Body Bathing - Progress: Progressing toward goals ADL Goal: Lower Body Dressing - Progress: Progressing toward goals ADL Goal: Toilet Transfer - Progress: Progressing toward goals ADL Goal: Toileting - Clothing Manipulation - Progress: Progressing toward goals ADL Goal: Toileting - Hygiene - Progress: Progressing toward goals ADL Goal: Tub/Shower Transfer - Progress: Progressing toward goals ADL Goal: Additional Goal #1 - Progress: Progressing toward goals  Visit Information  Last OT Received On: 11/16/11 Assistance Needed: +1    Subjective Data      Prior Functioning       Cognition  Overall Cognitive Status: Appears within functional limits for tasks assessed/performed Arousal/Alertness: Awake/alert Orientation Level: Appears intact for tasks assessed Behavior During Session: [facility] for tasks performed Cognition - Other Comments: poor insight into deficits     Mobility  Shoulder Instructions Bed Mobility Rolling Left: 5: Supervision;With rail Left Sidelying to Sit: 4: Min assist;With rails Details for Bed Mobility Assistance: pt unable to achieve full upright sitting from sidelying  Transfers Sit to Stand:  4: Min assist;From bed;With upper extremity assist Stand to Sit: 4: Min guard;To chair/3-in-1;With  armrests;To toilet Details for Transfer Assistance: VC for safe hand placement. Pt required Min A to achieve full upright standing from bed as pt experiencing incr hip pain        Exercises      Balance     End of Session OT - End of Session Equipment Utilized During Treatment: Gait belt;Back brace Activity Tolerance: Patient limited by fatigue;Patient limited by pain Patient left: in bed;with call bell/phone within reach Nurse Communication: Mobility status  GO     Hermina Barnard 11/16/2011, 3:50 PM

## 2011-11-16 NOTE — Progress Notes (Signed)
When getting out of ned to go to bathroom pt insisted to do everything on his own, sts he needs to be independent. This RN observed pt get out of the bed, put the brace on and ambulateto bathroom. It has taken him about 10 min. to get up. Pt sts he hopes to be able to do even more tomorrow. He than confided in me that in past he had some really bad experience with SNF, but that he might be willing to consider inpatient rehab within the hospital because "that is not  Same as the nursing homes." This RN explained to pt that there are certain qualifiers for inpatient rehab and pt sts he understand that.

## 2011-11-17 DIAGNOSIS — M4716 Other spondylosis with myelopathy, lumbar region: Secondary | ICD-10-CM

## 2011-11-17 DIAGNOSIS — IMO0002 Reserved for concepts with insufficient information to code with codable children: Secondary | ICD-10-CM

## 2011-11-17 LAB — GLUCOSE, CAPILLARY
Glucose-Capillary: 81 mg/dL (ref 70–99)
Glucose-Capillary: 165 mg/dL — ABNORMAL HIGH (ref 70–99)
Glucose-Capillary: 171 mg/dL — ABNORMAL HIGH (ref 70–99)

## 2011-11-17 LAB — CK TOTAL AND CKMB (NOT AT ARMC)
CK, MB: 2.6 ng/mL (ref 0.3–4.0)
CK, MB: 2.7 ng/mL (ref 0.3–4.0)
Relative Index: 1 (ref 0.0–2.5)
Relative Index: 1.2 (ref 0.0–2.5)
Total CK: 256 U/L — ABNORMAL HIGH (ref 7–232)
Total CK: 233 U/L — ABNORMAL HIGH (ref 7–232)

## 2011-11-17 LAB — TROPONIN I: Troponin I: <0.3 ng/mL (ref <0.30)

## 2011-11-17 MED ORDER — BISACODYL 10 MG RE SUPP
10.0000 mg | Freq: Every day | RECTAL | Status: DC | PRN
  Administered 2011-11-17: 10 mg via RECTAL
  Filled 2011-11-17: qty 1

## 2011-11-17 MED ORDER — FLEET ENEMA 7-19 GM/118ML RE ENEM
1.0000 | ENEMA | Freq: Once | RECTAL | Status: AC
  Administered 2011-11-17: 13:00:00 via RECTAL
  Filled 2011-11-17: qty 1

## 2011-11-17 NOTE — Progress Notes (Signed)
Patient most appropriate for SNF rehab at this time, not CIR. Please call with any questions. [phone]

## 2011-11-17 NOTE — Progress Notes (Signed)
Patient has been indecisive about discharge plan/disposition.  After a lengthy discussion patient has agreed to SNF or CIR for short term rehab, I called Dr. Lovell Sheehan and spoke to about discontinuing the order for discharge to home today.  I spoke with Rehab [facility] nurse and the CSW to ensure that the whole team was on the same page.  I am awaiting to heart back from Rehab  Patient is stable.

## 2011-11-17 NOTE — Discharge Summary (Signed)
Physician Discharge Summary  Patient ID: [name] MRN: [id number] DOB/AGE: [birth year] 71 y.o.  Admit date: 11/12/2011 Discharge date: 11/17/2011  Admission Diagnoses: L4-5 spondylolisthesis, spinal stenosis, neurogenic claudication, lumbago  Discharge Diagnoses: The same Active Problems:  * No active hospital problems. *    Discharged Condition: good  Hospital Course: I admitted the patient to [facility] hospital May 29 13. On that day I performed an L4-5 decompression, agitation, and fusion. Surgery went well. The patient's postoperative course was unremarkable. We had PT and OT see the patient. We discussed transfer to a rehabilitation facility but the patient declined and preferred to go home with home PT and OT. The patient did complain of some constipation and was treated with a Dulcolax suppository.  By 11/17/2011 the patient was afebrile, his vital signs were stable, and he was requesting discharge to home. The patient was given oral and written discharge instructions. All his questions were answered.  Consults: None Significant Diagnostic Studies: None Treatments: L4-5 decompression, each patient, and fusion Discharge Exam: Blood pressure 133/57, pulse 80, temperature 98.1 F (36.7 C), temperature source Oral, resp. rate 20, height 5\' 7"  (1.702 m), weight 100.245 kg (221 lb), SpO2 95.00%. The patient is alert and oriented. His strength is normal. His wound is healing well.  Disposition: Home  Discharge Orders    Future Orders Please Complete By Expires   Diet - low sodium heart healthy      Nursing communication      Comments:   Please arrange for home PT and OT.   Increase activity slowly      Discharge instructions      Comments:   Call [phone] for followup appointment.   No dressing needed      Call MD for:  temperature >100.4      Call MD for:  persistant nausea and vomiting      Call MD for:  severe uncontrolled pain      Call MD for:  redness,  tenderness, or signs of infection (pain, swelling, redness, odor or green/yellow discharge around incision site)      Call MD for:  difficulty breathing, headache or visual disturbances      Call MD for:  hives      Call MD for:  persistant dizziness or light-headedness      Call MD for:  extreme fatigue        Medication List  As of 11/17/2011  7:43 AM   TAKE these medications         aspirin EC 81 MG tablet   Take 81 mg by mouth daily.      atenolol 100 MG tablet   Commonly known as: TENORMIN   Take 50 mg by mouth daily.      diazepam 5 MG tablet   Commonly known as: VALIUM   Take 1 tablet (5 mg total) by mouth every 6 (six) hours as needed.      DSS 100 MG Caps   Take 100 mg by mouth 2 (two) times daily.      enalapril 20 MG tablet   Commonly known as: VASOTEC   Take 10 mg by mouth every morning.      fish oil-omega-3 fatty acids 1000 MG capsule   Take 1 g by mouth every morning.      fluvastatin XL 80 MG 24 hr tablet   Commonly known as: LESCOL XL   Take 40 mg by mouth every morning.  glyBURIDE 5 MG tablet   Commonly known as: DIABETA   Take 5 mg by mouth 2 (two) times daily with a meal.      insulin glargine 100 UNIT/ML injection   Commonly known as: LANTUS   Inject 60 Units into the skin at bedtime.      metFORMIN 1000 MG tablet   Commonly known as: GLUCOPHAGE   Take 1,000 mg by mouth 2 (two) times daily with a meal.      multivitamin with minerals Tabs   Take 1 tablet by mouth daily.      nitroGLYCERIN 0.4 MG SL tablet   Commonly known as: NITROSTAT   Place 0.4 mg under the tongue every 5 (five) minutes as needed. For chest pain      oxyCODONE-acetaminophen 10-325 MG per tablet   Commonly known as: PERCOCET   Take 1 tablet by mouth every 4 (four) hours as needed for pain.      sertraline 50 MG tablet   Commonly known as: ZOLOFT   Take 100 mg by mouth daily.      Tamsulosin HCl 0.4 MG Caps   Commonly known as: FLOMAX   Take 0.4 mg by mouth  daily.      VITAMIN E PO   Take 1 tablet by mouth 2 (two) times daily.             SignedCristi Loron 11/17/2011, 7:43 AM

## 2011-11-17 NOTE — Clinical Social Work Placement (Signed)
     Clinical Social Work Department CLINICAL SOCIAL WORK PLACEMENT NOTE 11/17/2011  Patient:  [name], [name]  Account Number:  [id number] Admit date:  11/12/2011  Clinical Social Worker:  Lupita Leash Angelize Ryce, BSW  Date/time:  11/17/2011 03:08 PM  Clinical Social Work is seeking post-discharge placement for this patient at the following level of care:   SKILLED NURSING   (*CSW will update this form in Epic as items are completed)   11/17/2011  Patient/family provided with [facility] Department of Clinical Social Works list of facilities offering this level of care within the geographic area requested by the patient (or if unable, by the patients family).  11/17/2011  Patient/family informed of their freedom to choose among providers that offer the needed level of care, that participate in Medicare, Medicaid or managed care program needed by the patient, have an available bed and are willing to accept the patient.  11/17/2011  Patient/family informed of [facility] ownership interest in [facility], as well as of the fact that they are under no obligation to receive care at this facility.  PASARR submitted to EDS on  PASARR number received from EDS on   FL2 transmitted to all facilities in geographic area requested by pt/family on   FL2 transmitted to all facilities within larger geographic area on   Patient informed that his/her managed care company has contracts with or will negotiate with  certain facilities, including the following:   Patient has existing PASARR number     Patient/family informed of bed offers received:   Patient chooses bed at  Physician recommends and patient chooses bed at    Patient to be transferred to  on   Patient to be transferred to facility by Ambulance Sharin Mons)  The following physician request were entered in Epic:   Additional Comments:

## 2011-11-17 NOTE — Progress Notes (Signed)
Received call from [name]- LCSWA- -patient has changed his mind and now agrees to SNF vs CIR placement. SNF search will be initiated today;  Fl2 will be sent to area SNF's today for review.  Existing PASARR is in place due to past SNF placement.   Lorri Frederick. [place]  [phone]

## 2011-11-17 NOTE — Consult Note (Signed)
Physical Medicine and Rehabilitation Consult Reason for Consult: Lumbar stenosis with radiculopathy Referring Physician: Dr. Tressie Stalker   HPI: [name] is a 71 y.o. right-handed male with history of CAD and coronary artery bypass grafting, type 2 diabetes mellitus with peripheral neuropathy. Admitted 11/12/2011 with chronic back pain radiating to the lower extremity. X-ray and imaging revealed spondylolisthesis L4-5, degenerative disc disease, spinal stenosis, lumbar radiculopathy. No relief with conservative care. Underwent L4 laminectomy to decompress the bilateral L4-5 nerve roots with posterior lumbar interbody fusion 11/12/2011 per Dr. Tressie Stalker. Fitted with lumbar corset to be applied in the sitting position. Postoperative pain management with Percocet as needed. Patient with episode of mid chest sharp pain radiating to the left arm 11/17/2011 with cardiac enzymes pending. He did receive nitroglycerin and no further complaints of chest pain. Patient has been receiving both physical and occupational therapy needing multiple cues for safety. Occupational therapy as well as M.D. has requested physical medicine rehabilitation consult to consider inpatient rehabilitation services.   Review of Systems  Gastrointestinal: Positive for constipation.  Genitourinary: Positive for urgency and frequency.  Musculoskeletal: Positive for myalgias and back pain.  Psychiatric/Behavioral: Positive for depression.  All other systems reviewed and are negative.   Past Medical History  Diagnosis Date  . Anginal pain   . Coronary artery disease   . Hypertension   . Depression   . GERD (gastroesophageal reflux disease)     OCC INDIGESTION   . High cholesterol   . Left leg DVT ~ 2008    "groin to ankle"  . Type II diabetes mellitus   . Arthritis     "back and left hand; not much"  . Chronic lower back pain 1960's - present (11/12/2011)  . Colon cancer     "started in my colon; spread to  liver, lungs, lymph nodes"  . Liver cancer   . Lung cancer    Past Surgical History  Procedure Date  . Cardiac catheterization     2008  [facility]   . Portacath placement 1990's    "first on upper left chest; moved to upper right chest"  . Cholecystectomy   . Carpal tunnel release ` 2010    right  . Posterior fusion lumbar spine 11/12/2011  . Cataract extraction w/ intraocular lens  implant, bilateral   . Shoulder open rotator cuff repair 2011    right  . Liver lobectomy 2000's    "I had cancer"  . Colectomy early 2000's    "cancer"  . Back surgery   . Coronary artery bypass graft 2000    CABG X5   History reviewed. No pertinent family history. Social History:  reports that he has never smoked. He has never used smokeless tobacco. He reports that he drinks alcohol. He reports that he does not use illicit drugs. Allergies:  Allergies  Allergen Reactions  . Amoxicillin Anaphylaxis    Stops breathing  . Lipitor (Atorvastatin) Other (See Comments)    Aches all over, really badly.   Medications Prior to Admission  Medication Sig Dispense Refill  . aspirin EC 81 MG tablet Take 81 mg by mouth daily.      Marland Kitchen atenolol (TENORMIN) 100 MG tablet Take 50 mg by mouth daily.      . enalapril (VASOTEC) 20 MG tablet Take 10 mg by mouth every morning.      . fish oil-omega-3 fatty acids 1000 MG capsule Take 1 g by mouth every morning.      Marland Kitchen  fluvastatin XL (LESCOL XL) 80 MG 24 hr tablet Take 40 mg by mouth every morning.      . glyBURIDE (DIABETA) 5 MG tablet Take 5 mg by mouth 2 (two) times daily with a meal.      . insulin glargine (LANTUS) 100 UNIT/ML injection Inject 60 Units into the skin at bedtime.      . metFORMIN (GLUCOPHAGE) 1000 MG tablet Take 1,000 mg by mouth 2 (two) times daily with a meal.      . Multiple Vitamin (MULTIVITAMIN WITH MINERALS) TABS Take 1 tablet by mouth daily.      . nitroGLYCERIN (NITROSTAT) 0.4 MG SL tablet Place 0.4 mg under the tongue every 5 (five)  minutes as needed. For chest pain      . sertraline (ZOLOFT) 50 MG tablet Take 100 mg by mouth daily.      . Tamsulosin HCl (FLOMAX) 0.4 MG CAPS Take 0.4 mg by mouth daily.      Marland Kitchen VITAMIN E PO Take 1 tablet by mouth 2 (two) times daily.        Home: Home Living Lives With: Alone Available Help at Discharge: Neighbor Type of Home: House Home Access: Stairs to enter [employer] of Steps: 1 Home Layout: Two level;Bed/bath upstairs;1/2 bath on main level Alternate Level Stairs-Number of Steps: 14 Alternate Level Stairs-Rails: Right Bathroom Shower/Tub: Tub/shower unit;Curtain Bathroom Toilet: Standard Bathroom Accessibility: No Home Adaptive Equipment: Walker - rolling;Bedside commode/3-in-1 Additional Comments: Pt has a dog and a cat that he is responsible for care - he insists that he has no supports to assist with this  Functional History: Prior Function Able to Take Stairs?: Yes Driving: Yes Vocation: Retired Functional Status:  Mobility: Bed Mobility Bed Mobility: Sit to Supine Rolling Right: 4: Min assist;5: Set up Rolling Left: 5: Supervision;With rail Right Sidelying to Sit: 3: Mod assist Left Sidelying to Sit: 4: Min assist;With rails Sitting - Scoot to Edge of Bed: 2: Max assist Sit to Supine: 2: Max assist;HOB flat;Other (comment) ( A to lift both legs back onto bed) Sit to Sidelying Right: 3: Mod assist;With rail;HOB flat Sit to Sidelying Left: 3: Mod assist;HOB flat Transfers Transfers: Sit to Stand;Stand to Sit Sit to Stand: 4: Min assist;From bed;With upper extremity assist Stand to Sit: 4: Min guard;To chair/3-in-1;With armrests;To toilet Ambulation/Gait Ambulation/Gait Assistance: 4: Min guard Ambulation Distance (Feet): 200 Feet Assistive device: Rolling walker Ambulation/Gait Assistance Details: Max cues throughout to increase step length. Cues to look up and stand tall.  Gait Pattern: Step-through pattern;Decreased stride length;Shuffle Gait  velocity: Slow gait speed, but closer to [facility] than yesterday. General Gait Details: Patient takes several short steps and stops.  Needs verbal cues to initiate gait again.  Increased time for gait. Stairs: No    ADL: ADL Grooming: Performed;Min guard Where Assessed - Grooming: Unsupported standing (occasional support on sink) Upper Body Bathing: Simulated;Maximal assistance Where Assessed - Upper Body Bathing: Supported sitting Lower Body Bathing: Simulated;+1 Total assistance Where Assessed - Lower Body Bathing: Supported sit to stand Upper Body Dressing: Performed;+1 Total assistance (don brace) Where Assessed - Upper Body Dressing: Supported sitting Lower Body Dressing: Performed;Maximal assistance Where Assessed - Lower Body Dressing: Supported sit to Pharmacist, hospital: Performed;Min Pension scheme manager Method: Sit to Barista: Raised toilet seat with arms (or 3-in-1 over toilet) (Assist for hand placement and sequencing) Equipment Used: Back brace;Gait belt;Rolling walker Transfers/Ambulation Related to ADLs: Pt Min guard to Min A with RW ambulation. Short,  shuffling steps. Able to incr stride length for 1 to 2 steps when given cues, but quickly reverts back to small steps. Pt with LOB backwards when backing up and required assist to regain balance. ADL Comments: Pt educated on AE use and where to purchase. Pt concerned that [employer] not cover equipment. Discussed at length pt's safety at home. DO NOT FEEL PT SAFE TO RETURN HOME however, pt adamantly declining SNF. Pt to sleep in recliner chair (encouraged pillows for support) and plans to ambulate to/from bathroom as needed. Pt is hoping that neighbor will provide assist as she did last time but has no guarantee of this. Strongly encouraged SNF.   Cognition: Cognition Arousal/Alertness: Awake/alert Orientation Level: Oriented X4 Cognition Overall Cognitive Status: Appears within functional limits  for tasks assessed/performed Area of Impairment: Memory;Safety/judgement Arousal/Alertness: Awake/alert Orientation Level: Appears intact for tasks assessed Behavior During Session: [facility] for tasks performed Current Attention Level: Sustained Memory: Decreased recall of precautions Memory Deficits: unable to recallOT session from yesterday? Safety/Judgement: Decreased awareness of safety precautions;Decreased safety judgement for tasks assessed Awareness of Errors: Assistance required to identify errors made;Assistance required to correct errors made Problem Solving: mod verbal cues Cognition - Other Comments: poor insight into deficits   Blood pressure 133/57, pulse 80, temperature 98.1 F (36.7 C), temperature source Oral, resp. rate 20, height 5\' 7"  (1.702 m), weight 100.245 kg (221 lb), SpO2 95.00%. Physical Exam  Vitals reviewed. Constitutional: He is oriented to person, place, and time. He appears well-developed.  HENT:  Head: Normocephalic.  Neck: Neck supple. No thyromegaly present.  Cardiovascular: Normal rate and regular rhythm.   Pulmonary/Chest: Breath sounds normal. He has no wheezes.  Abdominal: Bowel sounds are normal. He exhibits no distension. There is no tenderness.  Musculoskeletal:       Back wound dressed. Area is appropriately tender.   Neurological: He is alert and oriented to person, place, and time.       Generally has intact movement in both legs but generalized weakness proximal more than distal. No sensory deficits seen. Upper extremities slightly limited by rotator cuff pain.   Skin:       Back incision dressed, clean and dry  Psychiatric: He has a normal mood and affect. His behavior is normal.    Results for orders placed during the hospital encounter of 11/12/11 (from the past 24 hour(s))  GLUCOSE, CAPILLARY     Status: Abnormal   Collection Time   11/16/11 11:32 AM      Component Value Range   Glucose-Capillary 142 (*) 70 - 99 mg/dL  GLUCOSE,  CAPILLARY     Status: Normal   Collection Time   11/16/11  4:36 PM      Component Value Range   Glucose-Capillary 96  70 - 99 mg/dL  GLUCOSE, CAPILLARY     Status: Normal   Collection Time   11/16/11  9:38 PM      Component Value Range   Glucose-Capillary 79  70 - 99 mg/dL   Comment 1 Documented in Chart     Comment 2 Notify RN    CK TOTAL AND CKMB     Status: Abnormal   Collection Time   11/17/11  3:15 AM      Component Value Range   Total CK 256 (*) 7 - 232 U/L   CK, MB 2.6  0.3 - 4.0 ng/mL   Relative Index 1.0  0.0 - 2.5  TROPONIN I     Status: Normal  Collection Time   11/17/11  3:15 AM      Component Value Range   Troponin I <0.30  <0.30 ng/mL  GLUCOSE, CAPILLARY     Status: Normal   Collection Time   11/17/11  6:42 AM      Component Value Range   Glucose-Capillary 81  70 - 99 mg/dL   Comment 1 Documented in Chart     Comment 2 Notify RN     No results found.  Assessment/Plan: Diagnosis: lumbar stenosis with radiculopathy/myelopathy 1. Does the need for close, 24 hr/day medical supervision in concert with the patient's rehab needs make it unreasonable for this patient to be served in a less intensive setting? No 2. Co-Morbidities requiring supervision/potential complications: see above 3. Due to bladder management, bowel management, safety, skin/wound care, disease management, medication administration, pain management and patient education, does the patient require 24 hr/day rehab nursing? Yes 4. Does the patient require coordinated care of a physician, rehab nurse, PT (1-2 hrs/day, 5 days/week) and OT (1-2 hrs/day, 5 days/week) to address physical and functional deficits in the context of the above medical diagnosis(es)? Yes Addressing deficits in the following areas: balance, endurance, locomotion, strength, transferring, bowel/bladder control, bathing, dressing, feeding, grooming and swallowing 5. Can the patient actively participate in an intensive therapy program of at  least 3 hrs of therapy per day at least 5 days per week? No and Potentially 6. The potential for patient to make measurable gains while on inpatient rehab is fair 7. Anticipated functional outcomes upon discharge from inpatient rehab are supervision to min assist with PT, supervision to min assist with OT, n/a with SLP. 8. Estimated rehab length of stay to reach the above functional goals is: n/a 9. Does the patient have adequate social supports to accommodate these discharge functional goals? No 10. Anticipated D/C setting: Home 11. Anticipated post D/C treatments: HH therapy 12. Overall Rehab/Functional Prognosis: good  RECOMMENDATIONS: This patient's condition is appropriate for continued rehabilitative care in the following setting: SNF Patient has agreed to participate in recommended program. Yes Note that insurance prior authorization may be required for reimbursement for recommended care.  Comment: The patient lacks social supports and has a 2 story house. He is afraid that CIR therapy intensity may be a bit much for him as well. At this point, SNF is probably the most appropriate option going forward.  Ivory Broad, MD    11/17/2011

## 2011-11-17 NOTE — Progress Notes (Signed)
Pt c/o mid-chest sharp pain radiating to lt arm and also c/o lt side of throat pain. V/S stable. Dr. Venetia Maxon was notified and ordered to do cardiac enzymes x3. EKG stat done w/ NSR. Nitroglycerin 0.4mg  given sublingual. O2 @ 2lpm on via nasal canula. @0345  pt resting and no further complains of any chest pain. Will continue to monitor.

## 2011-11-17 NOTE — Progress Notes (Signed)
Physical Therapy Treatment Patient Details Name: [name] MRN: [id number] DOB: 12-11-1940 Today's Date: 11/17/2011 Time: 7829-5621 PT Time Calculation (min): 31 min  PT Assessment / Plan / Recommendation Comments on Treatment Session  Patient still requiring assistance this morning for bed mobility and safe ambulation. Patient states he does not feel safe to DC home and I have collaborated with OT since yesterday and have learned that patient has fallen previously at home and has 2 dogs that he has to care for. At this time recommend CIR for patient to become as functionally independent as he can prior to DC home alone. Patient does have neighbor that would be able to check in on him as needed once DC'd    Follow Up Recommendations  Inpatient Rehab    Barriers to Discharge        Equipment Recommendations  3 in 1 bedside comode;Tub/shower bench    Recommendations for Other Services    Frequency Min 5X/week   Plan Frequency remains appropriate;Discharge plan needs to be updated    Precautions / Restrictions Precautions Precautions: Back Precaution Comments: Patient able to recall all precautions.  Required Braces or Orthoses: Spinal Brace Spinal Brace: Lumbar corset;Applied in sitting position Spinal Brace Comments: Patient required assistance to don brace correctly   Pertinent Vitals/Pain     Mobility  Bed Mobility Rolling Left: 4: Min assist;With rail Left Sidelying to Sit: 4: Min assist;With rails Sitting - Scoot to Edge of Bed: 4: Min guard Sit to Sidelying Left: 4: Min assist;With rail Details for Bed Mobility Assistance: Patient required A to positining LEs in and out of bed as he lacked strength to do on his own. Patient with heavy reliance on rails to elevate himself up into sitting. VCs throughout to ensure no twisting at his back Transfers Sit to Stand: 4: Min guard;With upper extremity assist;From chair/3-in-1;From bed;With armrests Stand to Sit: 4: Min  guard;With upper extremity assist;To bed;To chair/3-in-1 Details for Transfer Assistance: Cues for safe hand placement as patient wanting to pull up on RW. Patient unable to stand without UE support.  Ambulation/Gait Ambulation/Gait Assistance: 4: Min assist Ambulation Distance (Feet): 200 Feet Assistive device: Rolling walker Ambulation/Gait Assistance Details: Patient required A with turns and management of RW. Patient continued with shuffled gait if not cued otherwise. Patient hitting feet with bottom of walker with ambulation today Gait Pattern: Shuffle;Step-to pattern;Decreased step length - left;Decreased step length - right Gait velocity: decreased    Exercises     PT Diagnosis:    PT Problem List:   PT Treatment Interventions:     PT Goals Acute Rehab PT Goals PT Goal: Rolling Supine to Right Side - Progress: Progressing toward goal PT Goal: Supine/Side to Sit - Progress: Progressing toward goal PT Goal: Sit to Stand - Progress: Progressing toward goal PT Goal: Ambulate - Progress: Progressing toward goal  Visit Information  Last PT Received On: 11/17/11    Subjective Data      Cognition  Overall Cognitive Status: Appears within functional limits for tasks assessed/performed Arousal/Alertness: Awake/alert Orientation Level: Appears intact for tasks assessed Behavior During Session: [facility] for tasks performed    Balance     End of Session PT - End of Session Equipment Utilized During Treatment: Gait belt;Back brace Activity Tolerance: Patient tolerated treatment well Patient left: in chair;with call bell/phone within reach Nurse Communication: Mobility status   GP     Fredrich Birks 11/17/2011, 9:52 AM 11/17/2011 Fredrich Birks PTA [phone] pager [phone]  office

## 2011-11-17 NOTE — Progress Notes (Signed)
I have read and agree with the below change in discharge plan.  Ivonne Andrew PT, DPT Pager: [phone]

## 2011-11-17 NOTE — Progress Notes (Signed)
Occupational Therapy Treatment Patient Details Name: [name] MRN: [id number] DOB: 1940-07-22 Today's Date: 11/17/2011 Time: 4098-1191 OT Time Calculation (min): 40 min  OT Assessment / Plan / Recommendation Comments on Treatment Session Pt. continues to require cues for safety and to maintain precautions.  Currently, pt requires assistance for BADLs.  Pt. now realizes that he is not ready to discharge home and is agreeable to post acute rehab.    Follow Up Recommendations  Inpatient Rehab    Barriers to Discharge       Equipment Recommendations  3 in 1 bedside comode;Tub/shower bench    Recommendations for Other Services Rehab consult  Frequency Min 2X/week   Plan Discharge plan needs to be updated    Precautions / Restrictions Precautions Precautions: Back Precaution Comments: Requires mod verbal cues to maintain back precautions Required Braces or Orthoses: Spinal Brace Spinal Brace: Lumbar corset;Applied in sitting position Spinal Brace Comments: min verbal cues Restrictions Weight Bearing Restrictions: No   Pertinent Vitals/Pain     ADL  Toilet Transfer: Performed;Min guard (x 2) Toilet Transfer Method: Sit to Barista: Raised toilet seat with arms (or 3-in-1 over toilet) Toileting - Clothing Manipulation and Hygiene: Performed;Minimal assistance Where Assessed - Glass blower/designer Manipulation and Hygiene: Sit to stand from 3-in-1 or toilet Equipment Used: Back brace;Rolling walker Transfers/Ambulation Related to ADLs: min guard and min verbal cues to ambulate with RW ADL Comments: Pt. requires mod verbal cues for back precautions.  Pt. slow to move.  Pt. voices that he is frustrated that MD has discharged him, and he doesn't think he is ready.  Discussed with pt. that PT and OT have been recommending post acute rehab since evaluations, and he has been refusing.  Pt. reports that now he will agree to CIR and/or SNF    OT Diagnosis:      OT Problem List:   OT Treatment Interventions:     OT Goals ADL Goals ADL Goal: Toilet Transfer - Progress: Progressing toward goals ADL Goal: Toileting - Clothing Manipulation - Progress: Progressing toward goals ADL Goal: Toileting - Hygiene - Progress: Progressing toward goals ADL Goal: Additional Goal #1 - Progress: Progressing toward goals  Visit Information  Last OT Received On: 11/17/11 Assistance Needed: +1    Subjective Data      Prior Functioning       Cognition  Overall Cognitive Status: Impaired Area of Impairment: Memory;Safety/judgement Arousal/Alertness: Awake/alert Orientation Level: Appears intact for tasks assessed Behavior During Session: [facility] for tasks performed Current Attention Level: Selective Memory: Decreased recall of precautions Memory Deficits: requires reinforcement for precautions Safety/Judgement: Decreased awareness of safety precautions;Decreased safety judgement for tasks assessed Awareness of Errors: Assistance required to identify errors made;Assistance required to correct errors made    Mobility  Shoulder Instructions Bed Mobility Bed Mobility: Rolling Left;Left Sidelying to Sit Rolling Left: 4: Min guard;With rail Left Sidelying to Sit: 4: Min guard;With rails Sitting - Scoot to Edge of Bed: 4: Min guard Sit to Sidelying Left: 4: Min assist;With rail Details for Bed Mobility Assistance: Pt. requires cues to log roll Transfers Transfers: Sit to Stand;Stand to Sit Sit to Stand: 4: Min guard;With upper extremity assist;From chair/3-in-1;From bed;With armrests Stand to Sit: 4: Min guard;With upper extremity assist;To chair/3-in-1 Details for Transfer Assistance: Cues for safe hand placement as patient wanting to pull up on RW. Patient unable to stand without UE support.        Exercises      Balance  End of Session OT - End of Session Equipment Utilized During Treatment: Back brace Activity Tolerance: Patient tolerated  treatment well Patient left: in chair;with call bell/phone within reach;with nursing in room  GO     [name], [name] 11/17/2011, 10:13 AM

## 2011-11-17 NOTE — Clinical Social Work Note (Signed)
CSW met with pt to address reconsult for SNF. Per RN, pt is agreeable. CSW introduced herself and explained process of SNF placement. Pt shared that he is agreeable to SNF at this time. CIR is also evaluating pt. CSW will initiate SNF search in [facility]. CSW will follow up with bed offers. CSW will continue to follow.   Dede Query, MSW, Theresia Majors [phone]

## 2011-11-17 NOTE — Clinical Social Work Psychosocial (Signed)
     Clinical Social Work Department BRIEF PSYCHOSOCIAL ASSESSMENT 11/17/2011  Patient:  [name], [name]     Account Number:  [id number]     Admit date:  11/12/2011  Clinical Social Worker:  Burnard Hawthorne  Date/Time:  11/16/2011 02:46 PM  Referred by:  Physician  Date Referred:  11/16/2011 Referred for  SNF Placement   Other Referral:   Interview type:  Patient Other interview type:    PSYCHOSOCIAL DATA Living Status:  ALONE Admitted from facility:   Level of care:   Primary support name:  "My neighbor" Primary support relationship to patient:  NEIGHBOR Degree of support available:   Patient declined to give name of neighbor but states she is an Charity fundraiser and a friend who has helped hiim in the past. Pt's ex-wife lifes in [place] but patient relates that they are not close.    CURRENT CONCERNS Current Concerns  Post-Acute Placement   Other Concerns:   Therapy had originally recommended SNF; how has been upgraded to home with Home Health.  Will patient be safe at home?    SOCIAL WORK ASSESSMENT / PLAN Met with patient today to discuss possible need for short term SNF. Patient stated that he will refuse SNF as he had been at a SNF for 11 days in the past and that it was a very bad experience for him. CSW offered him other options but he continued to defer.  Patient states that he lives alone- his primary support is a neighbor who is his friend and she is a Engineer, civil (consulting). She has assisted him at home before. Patient states that his daughter in currently in [place] studying to be a Cabin crew. He plans to seek her assistance.  Patient relates current shoulder pain for a past fall; he does not have a life line device but states that he keeps his cell phone   Assessment/plan status:  Psychosocial Support/Ongoing Assessment of Needs Other assessment/ plan:   Information/referral to community resources:   Discussed Heritage manager and [employer]. Patient defers informaiton on [facility] but is interested in [facility] follow up.    PATIENTS/FAMILYS RESPONSE TO PLAN OF CARE: Patient is alert, oriented and very pleasant. Patient states that he will return home with help from a neighbor. He was very appreciative of CSW's visit.

## 2011-11-18 LAB — GLUCOSE, CAPILLARY
Glucose-Capillary: 123 mg/dL — ABNORMAL HIGH (ref 70–99)
Glucose-Capillary: 54 mg/dL — ABNORMAL LOW (ref 70–99)
Glucose-Capillary: 119 mg/dL — ABNORMAL HIGH (ref 70–99)

## 2011-11-18 NOTE — Progress Notes (Signed)
OT Cancellation Note  Treatment cancelled today due to pt deferred due to fatigue.[name] [phone] 11/18/2011, 4:49 PM
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
## 2011-11-18 NOTE — Progress Notes (Signed)
Pt. BP low. MD on call made aware. Pt under no s/s distress. MD said to just monitor for now.

## 2011-11-18 NOTE — Progress Notes (Signed)
Pt blood sugar low around dinner time. Pt encouraged to eat. Pt blood sugar rechecked. Currently stable. Pt insulin and blood sugar meds held.

## 2011-11-18 NOTE — Progress Notes (Signed)
Physical Therapy Treatment Patient Details Name: [name] MRN: [id number] DOB: [birth year] Today's Date: 11/18/2011 Time: 1308-6578 PT Time Calculation (min): 15 min  PT Assessment / Plan / Recommendation Comments on Treatment Session  Patient continues to make progress, especially with ambulation this session. Patient agreeable to STSNF at this time as he does not have 24/7 supervision and is still having safety concerns and needs at home.     Follow Up Recommendations       Barriers to Discharge        Equipment Recommendations  3 in 1 bedside comode;Tub/shower bench    Recommendations for Other Services    Frequency Min 5X/week   Plan Discharge plan remains appropriate;Frequency remains appropriate    Precautions / Restrictions Precautions Precautions: Back Precaution Comments: Patient able to recall but still requiring cues with mobility Required Braces or Orthoses: Spinal Brace Spinal Brace: Lumbar corset;Applied in sitting position   Pertinent Vitals/Pain     Mobility  Bed Mobility Rolling Left: 4: Min guard Left Sidelying to Sit: 4: Min guard;With rails Details for Bed Mobility Assistance: Cues for technique. Patient continues with heavy reliance on rails Transfers Sit to Stand: 4: Min guard;With upper extremity assist;From bed Stand to Sit: 4: Min guard;With upper extremity assist;To chair/3-in-1 Details for Transfer Assistance: Patient continuing to pull up on RW despite cueing. no LOB Ambulation/Gait Ambulation/Gait Assistance: 4: Min assist Ambulation Distance (Feet): 200 Feet Assistive device: Rolling walker Ambulation/Gait Assistance Details: Patient with increased step length throughout this session. Cues for posture and safety with RW management Gait Pattern: Step-through pattern;Decreased stride length;Trunk flexed    Exercises     PT Diagnosis:    PT Problem List:   PT Treatment Interventions:     PT Goals Acute Rehab PT Goals PT Goal:  Rolling Supine to Right Side - Progress: Progressing toward goal PT Goal: Supine/Side to Sit - Progress: Progressing toward goal PT Goal: Sit to Stand - Progress: Progressing toward goal PT Goal: Ambulate - Progress: Progressing toward goal  Visit Information  Last PT Received On: 11/18/11 Assistance Needed: +1    Subjective Data      Cognition  Overall Cognitive Status: Impaired Area of Impairment: Safety/judgement Arousal/Alertness: Awake/alert Orientation Level: Appears intact for tasks assessed Behavior During Session: [facility] for tasks performed Safety/Judgement: Decreased awareness of safety precautions;Impulsive    Balance     End of Session PT - End of Session Equipment Utilized During Treatment: Gait belt Activity Tolerance: Patient tolerated treatment well Patient left: in chair;with call bell/phone within reach Nurse Communication: Mobility status   GP     Fredrich Birks 11/18/2011, 11:32 AM 11/18/2011 Fredrich Birks PTA [phone] pager [phone] office

## 2011-11-18 NOTE — Progress Notes (Signed)
Patient ID: [name], male   DOB: 08-Jan-1941, 71 y.o.   MRN: [id number] Subjective:  The patient is alert and pleasant. He has changes mind about rehabilitation and wants to go into a rehabilitation facility or nursing home temporarily. The patient looks well.  Objective: Vital signs in last 24 hours: Temp:  [97.2 F (36.2 C)-98.4 F (36.9 C)] 98.4 F (36.9 C) (09/04 0534) Pulse Rate:  [75-99] 80  (09/04 0534) Resp:  [18-20] 18  (09/04 0534) BP: (115-182)/(53-70) 126/54 mmHg (09/04 0534) SpO2:  [93 %-97 %] 97 % (09/04 0534)  Intake/Output from previous day: 09/03 0701 - 09/04 0700 In: 120 [P.O.:120] Out: -  Intake/Output this shift:    Physical exam the patient is alert and oriented. His strength is normal.  Lab Results: No results found for this basename: WBC:2,HGB:2,HCT:2,PLT:2 in the last 72 hours BMET No results found for this basename: NA:2,K:2,CL:2,CO2:2,GLUCOSE:2,BUN:2,CREATININE:2,CALCIUM:2 in the last 72 hours  Studies/Results: No results found.  Assessment/Plan: Postop day #6: We are awaiting nursing home placement. He is ready for transfer when the arrangements are made.  LOS: 6 days     [name] D 11/18/2011, 9:17 AM

## 2011-11-19 LAB — GLUCOSE, CAPILLARY: Glucose-Capillary: 54 mg/dL — ABNORMAL LOW (ref 70–99)

## 2011-11-19 NOTE — Progress Notes (Signed)
Occupational Therapy Treatment Patient Details Name: [name] MRN: [id number] DOB: 1940/05/02 Today's Date: 11/19/2011 Time: 4098-1191 OT Time Calculation (min): 28 min  OT Assessment / Plan / Recommendation Comments on Treatment Session Pt. continues to improve.  Demonstrates improved understanding and independence with BADLs.  Still requires min cues for back precautions    Follow Up Recommendations  Skilled nursing facility;Supervision/Assistance - 24 hour    Barriers to Discharge       Equipment Recommendations  Defer to next venue    Recommendations for Other Services    Frequency Min 2X/week   Plan Discharge plan needs to be updated    Precautions / Restrictions Precautions Precautions: Back Precaution Comments: Pt. requires min cues to adhere to precautions during ADLs Required Braces or Orthoses: Spinal Brace Spinal Brace: Lumbar corset;Applied in sitting position Spinal Brace Comments: dons with supervision Restrictions Weight Bearing Restrictions: No   Pertinent Vitals/Pain     ADL  Grooming: Performed;Supervision/safety;Wash/dry hands;Wash/dry face;Shaving Where Assessed - Grooming: Unsupported standing Lower Body Dressing: Performed;Min guard Where Assessed - Lower Body Dressing: Supported sit to stand (Able to cross ankles over knees) Toilet Transfer: Performed;Min guard Statistician Method: Sit to Barista: Raised toilet seat with arms (or 3-in-1 over toilet) Toileting - Clothing Manipulation and Hygiene: Performed;Supervision/safety Where Assessed - Glass blower/designer Manipulation and Hygiene: Standing Equipment Used: Back brace;Rolling walker Transfers/Ambulation Related to ADLs: Pt. ambulates with min guard assist    OT Diagnosis:    OT Problem List:   OT Treatment Interventions:     OT Goals ADL Goals ADL Goal: Grooming - Progress: Progressing toward goals ADL Goal: Lower Body Bathing - Progress: Progressing  toward goals ADL Goal: Lower Body Dressing - Progress: Progressing toward goals ADL Goal: Toilet Transfer - Progress: Progressing toward goals ADL Goal: Toileting - Clothing Manipulation - Progress: Progressing toward goals ADL Goal: Toileting - Hygiene - Progress: Progressing toward goals ADL Goal: Additional Goal #1 - Progress: Progressing toward goals  Visit Information  Last OT Received On: 11/19/11 Assistance Needed: +1    Subjective Data      Prior Functioning       Cognition  Overall Cognitive Status: Appears within functional limits for tasks assessed/performed Arousal/Alertness: Awake/alert Orientation Level: Appears intact for tasks assessed Behavior During Session: [facility] for tasks performed    Mobility  Shoulder Instructions Bed Mobility Bed Mobility: Rolling Left;Left Sidelying to Sit Rolling Left: 5: Supervision;With rail Left Sidelying to Sit: 5: Supervision;HOB flat;With rails Sitting - Scoot to Edge of Bed: 5: Supervision;With rail Transfers Transfers: Sit to Stand;Stand to Sit Sit to Stand: 5: Supervision;From bed;With upper extremity assist Stand to Sit: 5: Supervision;To bed;With upper extremity assist       Exercises      Balance     End of Session OT - End of Session Equipment Utilized During Treatment: Back brace Activity Tolerance: Patient tolerated treatment well Patient left: in chair;with call bell/phone within reach  GO     Davier Tramell M 11/19/2011, 2:40 PM

## 2011-11-19 NOTE — Progress Notes (Signed)
Patient ID: [name], male   DOB: 08-14-1940, 71 y.o.   MRN: [id number] Subjective:  The patient is alert and pleasant. He feels and looks much better today.  Objective: Vital signs in last 24 hours: Temp:  [98.2 F (36.8 C)-99 F (37.2 C)] 98.4 F (36.9 C) (09/05 0621) Pulse Rate:  [78-87] 80  (09/05 0621) Resp:  [18] 18  (09/05 0621) BP: (92-131)/(50-88) 126/60 mmHg (09/05 0621) SpO2:  [94 %-100 %] 100 % (09/05 0621)  Intake/Output from previous day: 09/04 0701 - 09/05 0700 In: 600 [P.O.:600] Out: 2 [Urine:2] Intake/Output this shift: Total I/O In: 360 [P.O.:360] Out: -   Physical exam the patient is alert and oriented. His lower extremity strength is normal.  Lab Results: No results found for this basename: WBC:2,HGB:2,HCT:2,PLT:2 in the last 72 hours BMET No results found for this basename: NA:2,K:2,CL:2,CO2:2,GLUCOSE:2,BUN:2,CREATININE:2,CALCIUM:2 in the last 72 hours  Studies/Results: No results found.  Assessment/Plan: Postop day #7: We are awaiting placement.  LOS: 7 days     Julia Alkhatib D 11/19/2011, 9:51 AM
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
## 2011-11-19 NOTE — Progress Notes (Signed)
Physical Therapy Treatment Patient Details Name: [name] MRN: [id number] DOB: October 08, 1940 Today's Date: 11/19/2011 Time: 4098-1191 PT Time Calculation (min): 17 min  PT Assessment / Plan / Recommendation Comments on Treatment Session  Patient continuing to make progress and will continue with current  POC    Follow Up Recommendations  Skilled nursing facility    Barriers to Discharge        Equipment Recommendations  Defer to next venue    Recommendations for Other Services    Frequency Min 5X/week   Plan Discharge plan remains appropriate;Frequency remains appropriate    Precautions / Restrictions Precautions Precautions: Back Precaution Comments: Patient able to recall precautions however still needing some cues with mobility Required Braces or Orthoses: Spinal Brace Spinal Brace: Lumbar corset;Applied in sitting position Spinal Brace Comments: dons with supervision Restrictions Weight Bearing Restrictions: No   Pertinent Vitals/Pain     Mobility  Bed Mobility Bed Mobility: Rolling Left;Left Sidelying to Sit Rolling Left: 5: Supervision;With rail Left Sidelying to Sit: 5: Supervision Sitting - Scoot to Edge of Bed: 5: Supervision Sit to Supine: 5: Supervision Details for Bed Mobility Assistance: Cues to protect precautions.  Transfers Sit to Stand: 5: Supervision;With upper extremity assist Stand to Sit: 5: Supervision;With upper extremity assist Ambulation/Gait Ambulation/Gait Assistance: 4: Min guard Ambulation Distance (Feet): 400 Feet Assistive device: Rolling walker Ambulation/Gait Assistance Details: Cues for upright posture but patient able to ambulate with step through gait and great step length this session Gait Pattern: Step-through pattern    Exercises     PT Diagnosis:    PT Problem List:   PT Treatment Interventions:     PT Goals Acute Rehab PT Goals PT Goal: Supine/Side to Sit - Progress: Progressing toward goal PT Goal: Sit to Stand  - Progress: Progressing toward goal PT Goal: Ambulate - Progress: Progressing toward goal  Visit Information  Last PT Received On: 11/19/11 Assistance Needed: +1    Subjective Data      Cognition  Overall Cognitive Status: Appears within functional limits for tasks assessed/performed Arousal/Alertness: Awake/alert Orientation Level: Appears intact for tasks assessed Behavior During Session: [facility] for tasks performed    Balance     End of Session PT - End of Session Equipment Utilized During Treatment: Gait belt;Back brace Activity Tolerance: Patient tolerated treatment well Patient left: in chair;with call bell/phone within reach Nurse Communication: Mobility status   GP     Fredrich Birks 11/19/2011, 3:03 PM 11/19/2011 Fredrich Birks PTA [phone] pager [phone] office

## 2011-11-20 LAB — GLUCOSE, CAPILLARY
Glucose-Capillary: 125 mg/dL — ABNORMAL HIGH (ref 70–99)
Glucose-Capillary: 129 mg/dL — ABNORMAL HIGH (ref 70–99)

## 2011-11-20 NOTE — Progress Notes (Signed)
Occupational Therapy Treatment Patient Details Name: [name] MRN: [id number] DOB: May 14, 1940 Today's Date: 11/20/2011 Time:  -     OT Assessment / Plan / Recommendation Comments on Treatment Session Pt. for discharge to SNF.  COntinues to require reinforcement for precautions    Follow Up Recommendations  Skilled nursing facility;Supervision/Assistance - 24 hour    Barriers to Discharge       Equipment Recommendations  Defer to next venue    Recommendations for Other Services    Frequency     Plan Discharge plan remains appropriate    Precautions / Restrictions Precautions Precautions: Back Precaution Comments: Pt able to verbalize 3/3 precautions and followed today during treatment Required Braces or Orthoses: Spinal Brace Restrictions Weight Bearing Restrictions: No   Pertinent Vitals/Pain     ADL  ADL Comments: Pt observed ambulating in room, waiting for ride.  Pt. attempted to bend forward and scoot chair across room.  Pt instructed why this is unsafe, and reviewed back precautions with him.  Pt. ambulated across room to answer phone, and again attempted to bend to reach phone.  Reinforced precautions    OT Diagnosis:    OT Problem List:   OT Treatment Interventions:     OT Goals    Visit Information  Last OT Received On: 11/20/11 Assistance Needed: +1    Subjective Data      Prior Functioning       Cognition  Overall Cognitive Status: Appears within functional limits for tasks assessed/performed Area of Impairment: Safety/judgement Arousal/Alertness: Awake/alert Orientation Level: Appears intact for tasks assessed Behavior During Session: [name] for tasks performed Memory Deficits: requires reinforcement for precautions    Mobility  Shoulder Instructions         Exercises      Balance     End of Session OT - End of Session Equipment Utilized During Treatment: Back brace Activity Tolerance: Patient tolerated treatment well Patient  left: in chair  GO     Madelynne Lasker M 11/20/2011, 6:49 PM

## 2011-11-20 NOTE — Progress Notes (Signed)
Physical Therapy Treatment Patient Details Name: [name] MRN: [id number] DOB: 1941/01/31 Today's Date: 11/20/2011 Time: 4010-2725 PT Time Calculation (min): 21 min  PT Assessment / Plan / Recommendation Comments on Treatment Session  Patient continues to progress.  Needs to be independent at home therefore SNF for further therapy before home alone.  Pt for possible d/c today by MD.    Follow Up Recommendations  Skilled nursing facility    Barriers to Discharge        Equipment Recommendations  Defer to next venue    Recommendations for Other Services    Frequency Min 5X/week   Plan Discharge plan remains appropriate;Frequency remains appropriate    Precautions / Restrictions Precautions Precautions: Back Precaution Comments: Pt able to verbalize 3/3 precautions and followed today during treatment Required Braces or Orthoses: Spinal Brace Spinal Brace: Lumbar corset;Applied in sitting position Spinal Brace Comments: dons with supervision Restrictions Weight Bearing Restrictions: No   Pertinent Vitals/Pain No c/o during treatment.    Mobility  Bed Mobility Bed Mobility: Rolling Left;Left Sidelying to Sit Rolling Left: 5: Supervision;With rail Left Sidelying to Sit: 5: Supervision;With rails Sitting - Scoot to Edge of Bed: 5: Supervision Transfers Transfers: Sit to Stand;Stand to Sit Sit to Stand: 5: Supervision;With upper extremity assist Stand to Sit: 5: Supervision;With upper extremity assist Ambulation/Gait Ambulation/Gait Assistance: 5: Supervision Ambulation Distance (Feet): 400 Feet Assistive device: Rolling walker Ambulation/Gait Assistance Details: Cues for upright posture.  Trunk somewhat flexed.  No LOB. Gait Pattern: Step-through pattern;Trunk flexed Stairs: No      PT Goals Acute Rehab PT Goals PT Goal: Rolling Supine to Right Side - Progress: Progressing toward goal PT Goal: Supine/Side to Sit - Progress: Progressing toward goal PT Goal:  Sit to Stand - Progress: Progressing toward goal PT Goal: Ambulate - Progress: Progressing toward goal  Visit Information  Last PT Received On: 11/20/11 Assistance Needed: +1    Subjective Data  Subjective: "I hope I can go home soon." after snf   Cognition  Overall Cognitive Status: Appears within functional limits for tasks assessed/performed Arousal/Alertness: Awake/alert Orientation Level: Appears intact for tasks assessed Behavior During Session: [facility] for tasks performed         End of Session PT - End of Session Equipment Utilized During Treatment: Back brace Activity Tolerance: Patient tolerated treatment well Patient left: in chair;with call bell/phone within reach Nurse Communication: Mobility status       Newell Coral 11/20/2011, 11:00 AM  Newell Coral, PTA Acute Rehab [phone] (office)

## 2011-11-20 NOTE — Discharge Summary (Signed)
Physician Discharge Summary  Patient ID: [name] MRN: [id number] DOB/AGE: 1940-09-26 71 y.o.  Admit date: 11/12/2011 Discharge date: 11/20/2011  Admission Diagnoses: L4-5 spondylolisthesis, spinal stenosis, neurogenic claudication, lumbago, lumbar radiculopathy  Discharge Diagnoses: The same Active Problems:  * No active hospital problems. *    Discharged Condition: good  Hospital Course: I admitted the patient to [facility] on May 29 13. On that day I performed an L4-5 decompression, each patient, and fusion. The patient's postoperative course was unremarkable. Patient did not feel he was able to go home so arrangements were made [facility] placement. The patient was transferred to rehabilitation unit on 11/20/2011.  Consults: Rehabilitation Significant Diagnostic Studies: None Treatments: L4-5 decompression, each patient, and fusion. Discharge Exam: Blood pressure 124/61, pulse 80, temperature 98.3 F (36.8 C), temperature source Oral, resp. rate 18, height 5\' 7"  (1.702 m), weight 100.245 kg (221 lb), SpO2 100.00%. The patient is alert and oriented. His wound is healing well. His strength is normal his lower extremities.  Disposition: Skilled nursing facility.  Discharge Orders    Future Orders Please Complete By Expires   Diet - low sodium heart healthy      Diet - low sodium heart healthy      Nursing communication      Comments:   Please arrange for home PT and OT.   Increase activity slowly      Discharge instructions      Comments:   Call [phone] for followup appointment.   No dressing needed      Call MD for:  temperature >100.4      Call MD for:  persistant nausea and vomiting      Call MD for:  severe uncontrolled pain      Call MD for:  redness, tenderness, or signs of infection (pain, swelling, redness, odor or green/yellow discharge around incision site)      Call MD for:  difficulty breathing, headache or visual  disturbances      Call MD for:  hives      Call MD for:  persistant dizziness or light-headedness      Call MD for:  extreme fatigue      Increase activity slowly      No dressing needed      Call MD for:  temperature >100.4      Call MD for:  persistant nausea and vomiting      Call MD for:  severe uncontrolled pain      Call MD for:  redness, tenderness, or signs of infection (pain, swelling, redness, odor or green/yellow discharge around incision site)      Call MD for:  difficulty breathing, headache or visual disturbances      Call MD for:  hives      Call MD for:  persistant dizziness or light-headedness      Call MD for:  extreme fatigue        Medication List  As of 11/20/2011  7:31 AM   TAKE these medications         aspirin EC 81 MG tablet   Take 81 mg by mouth daily.      atenolol 100 MG tablet   Commonly known as: TENORMIN   Take 50 mg by mouth daily.      diazepam 5 MG tablet   Commonly known as: VALIUM   Take 1 tablet (5 mg total) by mouth every 6 (six) hours as needed.  DSS 100 MG Caps   Take 100 mg by mouth 2 (two) times daily.      enalapril 20 MG tablet   Commonly known as: VASOTEC   Take 10 mg by mouth every morning.      fish oil-omega-3 fatty acids 1000 MG capsule   Take 1 g by mouth every morning.      fluvastatin XL 80 MG 24 hr tablet   Commonly known as: LESCOL XL   Take 40 mg by mouth every morning.      glyBURIDE 5 MG tablet   Commonly known as: DIABETA   Take 5 mg by mouth 2 (two) times daily with a meal.      insulin glargine 100 UNIT/ML injection   Commonly known as: LANTUS   Inject 60 Units into the skin at bedtime.      metFORMIN 1000 MG tablet   Commonly known as: GLUCOPHAGE   Take 1,000 mg by mouth 2 (two) times daily with a meal.      multivitamin with minerals Tabs   Take 1 tablet by mouth daily.      nitroGLYCERIN 0.4 MG SL tablet   Commonly known as: NITROSTAT   Place 0.4 mg under the tongue every 5 (five) minutes as  needed. For chest pain      oxyCODONE-acetaminophen 10-325 MG per tablet   Commonly known as: PERCOCET   Take 1 tablet by mouth every 4 (four) hours as needed for pain.      sertraline 50 MG tablet   Commonly known as: ZOLOFT   Take 100 mg by mouth daily.      Tamsulosin HCl 0.4 MG Caps   Commonly known as: FLOMAX   Take 0.4 mg by mouth daily.      VITAMIN E PO   Take 1 tablet by mouth 2 (two) times daily.             SignedTressie Stalker D 11/20/2011, 7:31 AM

## 2011-11-20 NOTE — Clinical Social Work Note (Signed)
Pt is ready for discharge today to [facility]. Facility has received letter from [employer] and is able to close claim on pt's Medicare. Facility has received discharge summary and is ready to accept pt. Pt is agreeable to discharge plan. Pt's friend will provide transportation to facility. CSW is signing off as no further needs identified.   Dede Query, MSW, Theresia Majors [phone]

## 2011-11-20 NOTE — Progress Notes (Signed)
Hypoglycemic Event  CBG: 54  Treatment: 15 GM carbohydrate snack  Symptoms: None  Follow-up CBG: Time:2225 CBG Result:80  Possible Reasons for Event: Unknown  Comments/MD notified:    Alvira Philips  Remember to initiate Hypoglycemia Order Set & complete

## 2011-11-20 NOTE — Progress Notes (Signed)
Nutrition Brief Note  Patient identified on the Malnutrition Screening Tool (MST) report for weight loss, generating a score of 2.  Pt being d/c'ed to SNF today.   Body mass index is 34.61 kg/(m^2). 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   Current diet order is CHO Modified Medium, patient is consuming approximately 100% of meals at this time. Labs and medications reviewed.   No nutrition interventions warranted at this time. If nutrition issues arise, please consult RD.   Kendell Bane RD, LDN, CNSC [phone] Pager [phone] After Hours Pager

## 2011-11-20 NOTE — Progress Notes (Signed)
Pt d/c instructions provided. Pt verbalized understanding. Pt iv d/c intact. Pt under no s/s distress. 

## 2011-11-20 NOTE — Progress Notes (Signed)
Inpatient Diabetes Program Recommendations  AACE/ADA: New Consensus Statement on Inpatient Glycemic Control (2013)  Target Ranges:  Prepandial:   less than 140 mg/dL      Peak postprandial:   less than 180 mg/dL (1-2 hours)      Critically ill patients:  140 - 180 mg/dL     Inpatient Diabetes Program Recommendations Insulin - Basal: Discontinue Lantus   Patient has not had a dose of Lantus since 9/3 and continues to have episodes of HYPOglycemia.   Thank you  Piedad Climes RN,BSN,CDE Inpatient Diabetes Coordinator [phone] (team pager)

## 2013-06-01 ENCOUNTER — Other Ambulatory Visit: Payer: Self-pay | Admitting: Urology

## 2013-06-02 ENCOUNTER — Encounter (HOSPITAL_COMMUNITY): Payer: Self-pay | Admitting: *Deleted

## 2013-06-02 NOTE — Progress Notes (Addendum)
NPO AFTER MN. ARRIVE AT 0600. NEEDS ISTAT AND EKG. PT TO CALL BACK TO COMPLETE MED. L.IST.   REVIEWED CHART W/ DR ROSE MDA.  OK TO PROCEED.  PT CALLED BACK TO COMPLETE MED. LIST.  PT WILL TAKE CRESTOR AM DOS W/ SIP OF WATER.

## 2013-06-05 ENCOUNTER — Encounter (HOSPITAL_BASED_OUTPATIENT_CLINIC_OR_DEPARTMENT_OTHER): Payer: Medicare Other | Admitting: Anesthesiology

## 2013-06-05 ENCOUNTER — Ambulatory Visit (HOSPITAL_BASED_OUTPATIENT_CLINIC_OR_DEPARTMENT_OTHER)
Admission: RE | Admit: 2013-06-05 | Discharge: 2013-06-05 | Disposition: A | Discharge status: Home / Self Care | Payer: Medicare Other | Source: Ambulatory Visit | Attending: Urology | Admitting: Urology

## 2013-06-05 ENCOUNTER — Ambulatory Visit (HOSPITAL_BASED_OUTPATIENT_CLINIC_OR_DEPARTMENT_OTHER): Payer: Medicare Other | Admitting: Anesthesiology

## 2013-06-05 ENCOUNTER — Encounter (HOSPITAL_BASED_OUTPATIENT_CLINIC_OR_DEPARTMENT_OTHER): Payer: Self-pay | Admitting: *Deleted

## 2013-06-05 ENCOUNTER — Encounter (HOSPITAL_BASED_OUTPATIENT_CLINIC_OR_DEPARTMENT_OTHER)
Admission: RE | Disposition: A | Discharge status: Home / Self Care | Payer: Self-pay | Source: Ambulatory Visit | Attending: Urology

## 2013-06-05 DIAGNOSIS — Z8572 Personal history of non-Hodgkin lymphomas: Secondary | ICD-10-CM | POA: Insufficient documentation

## 2013-06-05 DIAGNOSIS — N471 Phimosis: Secondary | ICD-10-CM | POA: Insufficient documentation

## 2013-06-05 DIAGNOSIS — N478 Other disorders of prepuce: Secondary | ICD-10-CM | POA: Insufficient documentation

## 2013-06-05 DIAGNOSIS — Z86718 Personal history of other venous thrombosis and embolism: Secondary | ICD-10-CM | POA: Insufficient documentation

## 2013-06-05 DIAGNOSIS — E291 Testicular hypofunction: Secondary | ICD-10-CM | POA: Insufficient documentation

## 2013-06-05 DIAGNOSIS — N529 Male erectile dysfunction, unspecified: Secondary | ICD-10-CM | POA: Insufficient documentation

## 2013-06-05 DIAGNOSIS — N401 Enlarged prostate with lower urinary tract symptoms: Secondary | ICD-10-CM | POA: Insufficient documentation

## 2013-06-05 DIAGNOSIS — I10 Essential (primary) hypertension: Secondary | ICD-10-CM | POA: Insufficient documentation

## 2013-06-05 DIAGNOSIS — E119 Type 2 diabetes mellitus without complications: Secondary | ICD-10-CM | POA: Insufficient documentation

## 2013-06-05 DIAGNOSIS — E785 Hyperlipidemia, unspecified: Secondary | ICD-10-CM | POA: Insufficient documentation

## 2013-06-05 DIAGNOSIS — N320 Bladder-neck obstruction: Secondary | ICD-10-CM | POA: Insufficient documentation

## 2013-06-05 DIAGNOSIS — I2510 Atherosclerotic heart disease of native coronary artery without angina pectoris: Secondary | ICD-10-CM | POA: Insufficient documentation

## 2013-06-05 DIAGNOSIS — Z79899 Other long term (current) drug therapy: Secondary | ICD-10-CM | POA: Insufficient documentation

## 2013-06-05 DIAGNOSIS — Z8505 Personal history of malignant neoplasm of liver: Secondary | ICD-10-CM | POA: Insufficient documentation

## 2013-06-05 DIAGNOSIS — N138 Other obstructive and reflux uropathy: Secondary | ICD-10-CM | POA: Insufficient documentation

## 2013-06-05 DIAGNOSIS — Z85038 Personal history of other malignant neoplasm of large intestine: Secondary | ICD-10-CM | POA: Insufficient documentation

## 2013-06-05 HISTORY — DX: Personal history of other diseases of the circulatory system: Z86.79

## 2013-06-05 HISTORY — DX: Personal history of other malignant neoplasm of large intestine: Z85.038

## 2013-06-05 HISTORY — DX: Personal history of malignant neoplasm of other organs and systems: Z85.89

## 2013-06-05 HISTORY — DX: Personal history of malignant neoplasm of liver: Z85.05

## 2013-06-05 HISTORY — DX: Secondary malignant neoplasm of unspecified lung: C78.00

## 2013-06-05 HISTORY — DX: Hyperlipidemia, unspecified: E78.5

## 2013-06-05 HISTORY — DX: Personal history of other venous thrombosis and embolism: Z86.718

## 2013-06-05 HISTORY — PX: CIRCUMCISION: SHX1350

## 2013-06-05 HISTORY — DX: Presence of aortocoronary bypass graft: Z95.1

## 2013-06-05 LAB — POCT I-STAT 4, (NA,K, GLUC, HGB,HCT)
Glucose, Bld: 134 mg/dL — ABNORMAL HIGH (ref 70–99)
HCT: 44 % (ref 39.0–52.0)
Hemoglobin: 15 g/dL (ref 13.0–17.0)
Potassium: 4 mEq/L (ref 3.7–5.3)
Sodium: 141 mEq/L (ref 137–147)

## 2013-06-05 LAB — GLUCOSE, CAPILLARY: Glucose-Capillary: 160 mg/dL — ABNORMAL HIGH (ref 70–99)

## 2013-06-05 SURGERY — CIRCUMCISION, ADULT
Anesthesia: General | Site: Penis

## 2013-06-05 MED ORDER — ONDANSETRON HCL 4 MG/2ML IJ SOLN
INTRAMUSCULAR | Status: DC | PRN
  Administered 2013-06-05: 4 mg via INTRAVENOUS

## 2013-06-05 MED ORDER — BACITRACIN ZINC 500 UNIT/GM EX OINT
TOPICAL_OINTMENT | CUTANEOUS | Status: DC | PRN
  Administered 2013-06-05: 1 via TOPICAL

## 2013-06-05 MED ORDER — BUPIVACAINE HCL 0.5 % IJ SOLN
INTRAMUSCULAR | Status: DC | PRN
  Administered 2013-06-05: 10 mL

## 2013-06-05 MED ORDER — FENTANYL CITRATE 0.05 MG/ML IJ SOLN
25.0000 ug | INTRAMUSCULAR | Status: DC | PRN
  Filled 2013-06-05: qty 1

## 2013-06-05 MED ORDER — PROPOFOL 10 MG/ML IV BOLUS
INTRAVENOUS | Status: DC | PRN
  Administered 2013-06-05: 150 mg via INTRAVENOUS

## 2013-06-05 MED ORDER — OXYCODONE HCL 10 MG PO TABS: 10.0000 mg | ORAL_TABLET | ORAL | Status: DC | PRN

## 2013-06-05 MED ORDER — FENTANYL CITRATE 0.05 MG/ML IJ SOLN
INTRAMUSCULAR | Status: DC | PRN
  Administered 2013-06-05 (×2): 25 ug via INTRAVENOUS
  Administered 2013-06-05 (×2): 50 ug via INTRAVENOUS

## 2013-06-05 MED ORDER — CIPROFLOXACIN IN D5W 400 MG/200ML IV SOLN
400.0000 mg | INTRAVENOUS | Status: AC
  Administered 2013-06-05: 400 mg via INTRAVENOUS
  Filled 2013-06-05: qty 200

## 2013-06-05 MED ORDER — DEXAMETHASONE SODIUM PHOSPHATE 4 MG/ML IJ SOLN
INTRAMUSCULAR | Status: DC | PRN
  Administered 2013-06-05: 5 mg via INTRAVENOUS

## 2013-06-05 MED ORDER — PROMETHAZINE HCL 25 MG/ML IJ SOLN
6.2500 mg | INTRAMUSCULAR | Status: DC | PRN
  Filled 2013-06-05: qty 1

## 2013-06-05 MED ORDER — LIDOCAINE HCL (CARDIAC) 20 MG/ML IV SOLN
INTRAVENOUS | Status: DC | PRN
  Administered 2013-06-05: 60 mg via INTRAVENOUS

## 2013-06-05 MED ORDER — LACTATED RINGERS IV SOLN
INTRAVENOUS | Status: DC
  Administered 2013-06-05: 07:00:00 via INTRAVENOUS
  Filled 2013-06-05: qty 1000

## 2013-06-05 MED ORDER — KETOROLAC TROMETHAMINE 30 MG/ML IJ SOLN
INTRAMUSCULAR | Status: DC | PRN
  Administered 2013-06-05: 15 mg via INTRAVENOUS

## 2013-06-05 MED ORDER — FENTANYL CITRATE 0.05 MG/ML IJ SOLN
INTRAMUSCULAR | Status: AC
  Filled 2013-06-05: qty 4

## 2013-06-05 MED ORDER — EPHEDRINE SULFATE 50 MG/ML IJ SOLN
INTRAMUSCULAR | Status: DC | PRN
  Administered 2013-06-05: 15 mg via INTRAVENOUS
  Administered 2013-06-05: 10 mg via INTRAVENOUS

## 2013-06-05 MED ORDER — ACETAMINOPHEN 10 MG/ML IV SOLN
INTRAVENOUS | Status: DC | PRN
  Administered 2013-06-05: 1000 mg via INTRAVENOUS

## 2013-06-05 SURGICAL SUPPLY — 30 items
BANDAGE COBAN STERILE 2 (GAUZE/BANDAGES/DRESSINGS) ×1 IMPLANT
BLADE SURG 15 STRL LF DISP TIS (BLADE) ×1 IMPLANT
BLADE SURG 15 STRL SS (BLADE) ×2
BNDG COHESIVE 3X5 TAN STRL LF (GAUZE/BANDAGES/DRESSINGS) ×1 IMPLANT
COVER SURGICAL LIGHT HANDLE (MISCELLANEOUS) ×1 IMPLANT
DECANTER SPIKE VIAL GLASS SM (MISCELLANEOUS) IMPLANT
DRAPE LAPAROTOMY T 102X78X121 (DRAPES) ×2 IMPLANT
ELECT REM PT RETURN 9FT ADLT (ELECTROSURGICAL) ×2
ELECTRODE REM PT RTRN 9FT ADLT (ELECTROSURGICAL) ×1 IMPLANT
GAUZE SPONGE 4X4 16PLY XRAY LF (GAUZE/BANDAGES/DRESSINGS) ×1 IMPLANT
GLOVE BIO SURGEON STRL SZ8 (GLOVE) ×2 IMPLANT
GLOVE BIOGEL M STER SZ 6 (GLOVE) ×1 IMPLANT
GLOVE BIOGEL PI IND STRL 6.5 (GLOVE) IMPLANT
GLOVE BIOGEL PI INDICATOR 6.5 (GLOVE) ×1
GOWN STRL REUS W/TWL LRG LVL3 (GOWN DISPOSABLE) ×1 IMPLANT
GOWN STRL REUS W/TWL XL LVL3 (GOWN DISPOSABLE) ×2 IMPLANT
IV NS 500ML (IV SOLUTION) ×2
IV NS 500ML BAXH (IV SOLUTION) IMPLANT
NDL HYPO 25X1 1.5 SAFETY (NEEDLE) IMPLANT
NEEDLE HYPO 25X1 1.5 SAFETY (NEEDLE) IMPLANT
NS IRRIG 1000ML POUR BTL (IV SOLUTION) ×1 IMPLANT
PACK BASIN DAY SURGERY FS (CUSTOM PROCEDURE TRAY) ×1 IMPLANT
PENCIL BUTTON HOLSTER BLD 10FT (ELECTRODE) ×2 IMPLANT
SPONGE GAUZE 4X4 12PLY (GAUZE/BANDAGES/DRESSINGS) ×1 IMPLANT
SPONGE GAUZE 4X4 12PLY STER LF (GAUZE/BANDAGES/DRESSINGS) ×1 IMPLANT
SUT CHROMIC 3 0 SH 27 (SUTURE) ×5 IMPLANT
SUT CHROMIC 4 0 SH 27 (SUTURE) ×1 IMPLANT
SYR CONTROL 10ML LL (SYRINGE) IMPLANT
TRAY DSU PREP LF (CUSTOM PROCEDURE TRAY) ×1 IMPLANT
WATER STERILE IRR 1500ML POUR (IV SOLUTION) IMPLANT

## 2013-06-05 NOTE — Discharge Instructions (Signed)
Postoperative instructions for circumcision ° °Wound: ° °In most cases your incision will have absorbable sutures that run along the course of your incision and will dissolve within the first 10-20 days. Some will fall out even earlier. Expect some redness as the sutures dissolved but this should occur only around the sutures. If there is generalized redness, especially with increasing pain or swelling, let us know. The penis will very likely get "black and blue" as the blood in the tissues spread. Sometimes the whole penis will turn colors. The black and blue is followed by a yellow and brown color. In time, all the discoloration will go away. ° °Diet: ° °You may return to your normal diet within 24 hours following your surgery. You may note some mild nausea and possibly vomiting the first 6-8 hours following surgery. This is usually due to the side effects of anesthesia, and will disappear quite soon. I would suggest clear liquids and a very light meal the first evening following your surgery. ° °Activity: ° °Your physical activity should be restricted the first 48 hours. During that time you should remain relatively inactive, moving about only when necessary. During the first 7-10 days following surgery he should avoid lifting any heavy objects (anything greater than 15 pounds), and avoid strenuous exercise. If you work, ask us specifically about your restrictions, both for work and home. We will write a note to your employer if needed. ° °Ice packs can be placed on and off over the penis for the first 48 hours to help relieve the pain and keep the swelling down. Frozen peas or corn in a ZipLock bag can be frozen, used and re-frozen. Fifteen minutes on and 15 minutes off is a reasonable schedule.  ° °Hygiene: ° °You may shower 48 hours after your surgery. Tub bathing should be restricted until the seventh day. ° °Medication: ° °You will be sent home with some type of pain medication. In many cases you will be  sent home with a narcotic pain pill (Vicodin or Tylox). If the pain is not too bad, you may take either Tylenol (acetaminophen) or Advil (ibuprofen) which contain no narcotic agents, and might be tolerated a little better, with fewer side effects. If the pain medication you are sent home with does not control the pain, you will have to let us know. Some narcotic pain medications cannot be given or refilled by a phone call to a pharmacy. ° °Problems you should report to us: ° °· Fever of 101.0 degrees Fahrenheit or greater. °· Moderate or severe swelling under the skin incision or involving the scrotum. °· Drug reaction such as hives, a rash, nausea or vomiting.  ° ° ° ° °Post Anesthesia Home Care Instructions ° °Activity: °Get plenty of rest for the remainder of the day. A responsible adult should stay with you for 24 hours following the procedure.  °For the next 24 hours, DO NOT: °-Drive a car °-Operate machinery °-Drink alcoholic beverages °-Take any medication unless instructed by your physician °-Make any legal decisions or sign important papers. ° °Meals: °Start with liquid foods such as gelatin or soup. Progress to regular foods as tolerated. Avoid greasy, spicy, heavy foods. If nausea and/or vomiting occur, drink only clear liquids until the nausea and/or vomiting subsides. Call your physician if vomiting continues. ° °Special Instructions/Symptoms: °Your throat may feel dry or sore from the anesthesia or the breathing tube placed in your throat during surgery. If this causes discomfort, gargle with warm salt water.   should disappear within 24 hours.

## 2013-06-05 NOTE — Anesthesia Procedure Notes (Signed)
Procedure Name: LMA Insertion Date/Time: 06/05/2013 7:32 AM Performed by: Mechele Claude Pre-anesthesia Checklist: Patient identified, Emergency Drugs available, Suction available and Patient being monitored Patient Re-evaluated:Patient Re-evaluated prior to inductionOxygen Delivery Method: Circle System Utilized Preoxygenation: Pre-oxygenation with 100% oxygen Intubation Type: IV induction Ventilation: Mask ventilation without difficulty LMA: LMA inserted LMA Size: 4.0 Number of attempts: 1 Airway Equipment and Method: bite block Placement Confirmation: positive ETCO2 Tube secured with: Tape Dental Injury: Teeth and Oropharynx as per pre-operative assessment

## 2013-06-05 NOTE — Anesthesia Postprocedure Evaluation (Signed)
  Anesthesia Post-op Note  Patient: [name]  Procedure(s) Performed: Procedure(s) (LRB): CIRCUMCISION ADULT (N/A)  Patient Location: PACU  Anesthesia Type: General  Level of Consciousness: awake and alert   Airway and Oxygen Therapy: Patient Spontanous Breathing  Post-op Pain: mild  Post-op Assessment: Post-op Vital signs reviewed, Patient's Cardiovascular Status Stable, Respiratory Function Stable, Patent Airway and No signs of Nausea or vomiting  Last Vitals:  Filed Vitals:   06/05/13 0900  BP: 104/82  Pulse: 78  Temp:   Resp: 15    Post-op Vital Signs: stable   Complications: No apparent anesthesia complications

## 2013-06-05 NOTE — Transfer of Care (Signed)
Immediate Anesthesia Transfer of Care Note  Patient: [name]  Procedure(s) Performed: Procedure(s) (LRB): CIRCUMCISION ADULT (N/A)  Patient Location: PACU  Anesthesia Type: General  Level of Consciousness: awake, alert  and oriented  Airway & Oxygen Therapy: Patient Spontanous Breathing and Patient connected to face mask oxygen  Post-op Assessment: Report given to PACU RN and Post -op Vital signs reviewed and stable  Post vital signs: Reviewed and stable  Complications: No apparent anesthesia complications

## 2013-06-05 NOTE — Anesthesia Preprocedure Evaluation (Signed)
Anesthesia Evaluation  Patient identified by MRN, date of birth, ID band Patient awake    Reviewed: Allergy & Precautions, H&P , NPO status , Patient's Chart, lab work & pertinent test results  Airway Mallampati: II TM Distance: >3 FB Neck ROM: Full    Dental no notable dental hx.    Pulmonary neg pulmonary ROS,  breath sounds clear to auscultation  Pulmonary exam normal       Cardiovascular Exercise Tolerance: Good hypertension, Pt. on medications + CAD Rhythm:Regular Rate:Normal     Neuro/Psych PSYCHIATRIC DISORDERS Depression negative neurological ROS     GI/Hepatic Neg liver ROS, GERD-  ,  Endo/Other  negative endocrine ROSdiabetes, Type 2, Oral Hypoglycemic Agents, Insulin Dependent  Renal/GU negative Renal ROS  negative genitourinary   Musculoskeletal negative musculoskeletal ROS (+)   Abdominal   Peds negative pediatric ROS (+)  Hematology negative hematology ROS (+)   Anesthesia Other Findings   Reproductive/Obstetrics negative OB ROS                           Anesthesia Physical Anesthesia Plan  ASA: III  Anesthesia Plan: General   Post-op Pain Management:    Induction: Intravenous  Airway Management Planned: LMA  Additional Equipment:   Intra-op Plan:   Post-operative Plan: Extubation in OR  Informed Consent: I have reviewed the patients History and Physical, chart, labs and discussed the procedure including the risks, benefits and alternatives for the proposed anesthesia with the patient or authorized representative who has indicated his/her understanding and acceptance.   Dental advisory given  Plan Discussed with: CRNA  Anesthesia Plan Comments:         Anesthesia Quick Evaluation

## 2013-06-05 NOTE — Op Note (Signed)
PATIENT:  [name]  PRE-OPERATIVE DIAGNOSIS: Phimosis  POST-OPERATIVE DIAGNOSIS: 1. Phimosis 2. Tethered frenulum  PROCEDURE: 1. Circumcision 2. Release of tethered frenulum  SURGEON:  Claybon Jabs  INDICATION: [name] is a 73 year old male who has developed phimosis over approximately a 5 year period of time. It has become progressively worse to the point where he is no longer able to retract his foreskin. We discussed the treatment options and he has elected to proceed with surgical correction with circumcision.  ANESTHESIA:  General  EBL:  Minimal  DRAINS: None  LOCAL MEDICATIONS USED:  1/2% plain Marcaine  SPECIMEN:  None  Description of procedure: After informed consent the patient was taken to the operating room and placed on the table in a supine position. General anesthesia was then administered. Once fully anesthetized the patient was moved to the dorsal lithotomy position and the genitalia were sterilely prepped and draped in standard fashion. An official timeout was then performed.  I was able to eventually retract his foreskin with some effort and blunt dilation of the foreskin with a hemostat. Once the foreskin was retracted I noted a significant tethering of the frenulum. I prepped the inner preputial skin and glans again with Betadine. I then manage his tethered frenulum by using tenotomy scissors to divide the frenulum and allowing it to release the foreskin. I then proceeded with the circumcision.  A circumferential incision was made approximately 5 mm from the corona. I then replaced the foreskin in its normal anatomic position and outlined the corona with a surgical marker and then incised along this Dudley Cooley. I then used sharp dissection to excise the foreskin and then cauterized bleeding points with point cautery. I closed the frenulum in the midline with interrupted 4-0 chromic suture. A 3-0 chromic suture was then used as a U stitch at the 6:00  position and a second 3-0 chromic was placed at the 12:00 position. I then reapproximated the skin edges in a running fashion.   I then performed a dorsal penile block in the standard fashion. Neosporin was then applied to the suture line and the penis was gently wrapped with folded 4 x 4 gauze and this was secured loosely with coban. The patient tolerated the procedure well with no intraoperative complications. Needle, sponge and instrument counts were correct x2 at the end of the operation.  PLAN OF CARE: Discharge to home after PACU  PATIENT DISPOSITION:  PACU - hemodynamically stable.

## 2013-06-05 NOTE — H&P (Signed)
[name] is a 73 year old male with a history of erectile dysfunction.   History of Present Illness       Organic erectile function: He has multiple risk factors for ED including type 2 diabetes, hyperlipidemia, hypertension, hypogonadism and coronary artery disease. He takes Isordil and therefore is not a candidate for phosphodiesterase inhibitor therapy.  He reported that he had difficulty with erectile dysfunction for number of years. It began before he had his colon surgery and then subsequent radiation and chemotherapy. He noted that he is no longer able to achieve an erection at all.  Previous treatments: MUSE 1000 g      Hypogonadism: A serum testosterone and 10/13 was found to be 180.76. He really doesn't have much in the way of symptoms from this.    BPH with outlet obstruction. He had obstructive voiding symptoms and was placed on tamsulosin.    Phimosis: He was uncircumcised and states that over the years he has gotten to a point where he is unable to retract his foreskin. It's not been a major problem for him and he has not difficulty with balanitis.    Interval history: He came in to discuss several issues. The first is that of erectile dysfunction. It persists and he was prescribed intraurethral suppositories but did not use them because they were too expensive at $360 for 6 of them. He said he wanted to know if the medication was going to be extracted before he purchased yet. He says he currently is able to get a slight amount of increased turgor to the penis but does not get an erection anymore.  He also wanted me to realize that since all of his surgeries, radiation and chemotherapy is no longer having an ejaculation. I told him that that would likely persist. There is no treatment for that.  The third issue was that of his phimosis. It is becoming uncomfortable at times. It has worsened and has been present over about a 5 year period with progressive worsening.    Past Medical History Problems  1. History of Arthritis (V13.4) 2. History of Colon Cancer (V10.05) 3. History of Liver Cancer (V10.07) 4. History of Malignant Lymphoma (V10.71) 5. 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 History of Colon Surgery 2. History of Coronary Artery Triple Arterial Bypass Graft 3. History of Liver Surgery 4. History of Shoulder Surgery Right 5. History of Spine Repair 6. History of Wrist Surgery  Current Meds 1. Aspirin 81 MG Oral Tablet;  Therapy: (Recorded:12Nov2013) to Recorded 2. Atenolol 100 MG Oral Tablet;  Therapy: 02Apr2013 to Recorded 3. Fish Oil 1000 MG Oral Capsule;  Therapy: (Recorded:12Nov2013) to Recorded 4. GlyBURIDE 5 MG Oral Tablet;  Therapy: 02Apr2013 to Recorded 5. HumaLOG 100 UNIT/ML Subcutaneous Solution;  Therapy: (Recorded:12Nov2013) to Recorded 6. Isosorbide Mononitrate ER 30 MG Oral Tablet Extended Release 24 Hour; TAKE 1  TABLET 3 times daily;  Therapy: (Recorded:12Nov2013) to Recorded 7. Lescol XL 80 MG Oral Tablet Extended Release 24 Hour;  Therapy: 29Apr2013 to Recorded 8. MetFORMIN HCl - 500 MG Oral Tablet;  Therapy: [id number] to Recorded 9. Muse 1000 MCG Urethral Pellet; USE AS DIRECTED;  Therapy: [id number] to (Last Rx:12Nov2013)  Requested for: [id number] Ordered 10. Nitrostat 0.4 MG Sublingual Tablet Sublingual;   Therapy: (Recorded:12Nov2013) to Recorded 11. OneTouch Ultra Blue STRP;   Therapy: (Recorded:12Nov2013) to Recorded 12. Tamsulosin HCl - 0.4 MG Oral Capsule;   Therapy: [id number] to Recorded  Allergies Medication  1. Amoxicillin TABS 2. Lipitor TABS  Family History Problems  1. Family history of Acute Myocardial Infarction (V17.3) 2. Family history of Death In The Family Father 3. Family history of Death In The Family Mother 4. Family history of Diabetes Mellitus (V18.0) 5. Family history of Family Health Status Children  ___ Living Daughters   1  Social History Problems  1. Alcohol Use   1 2. Caffeine Use   6-10; coffee 3. Marital History - Divorced (V61.03) 4. Never A Smoker 5. Retired From Work  Review of [employer], constitutional, skin, eye, otolaryngeal, hematologic/lymphatic, cardiovascular, pulmonary, endocrine, musculoskeletal, gastrointestinal, neurological and psychiatric system(s) were reviewed and pertinent findings if present are noted.  Genitourinary: urinary frequency, nocturia, weak urinary stream, urinary stream starts and stops and erectile dysfunction.  Gastrointestinal: constipation.  Eyes: blurred vision.  Cardiovascular: leg swelling.  Musculoskeletal: back pain and joint pain.  Psychiatric: depression.    Vitals Vital Signs  BMI Calculated: 32.18 BSA Calculated: 2.04 Height: 5 ft 7 in Weight: 205 lb  Blood Pressure: 156 / 80 Temperature: 97.8 F Heart Rate: 88  Physical Exam Constitutional: Well nourished and well developed . No acute distress.  ENT:. The ears and nose are normal in appearance.  Neck: The appearance of the neck is normal and no neck mass is present.  Pulmonary: No respiratory distress and normal respiratory rhythm and effort.  Cardiovascular: Heart rate and rhythm are normal . No peripheral edema.  Abdomen: The abdomen is soft and nontender. No masses are palpated. No CVA tenderness. No hernias are palpable. No hepatosplenomegaly noted.  Genitourinary: Examination of the penis demonstrates phimosis, but no discharge, no masses, no lesions and a normal meatus. The penis is uncircumcised. The scrotum is without lesions. The right epididymis is palpably normal and non-tender. The left epididymis is palpably normal and non-tender. The right testis is non-tender and without masses. The left testis is non-tender and without masses.  Lymphatics: The femoral and inguinal nodes are not enlarged or tender.  Skin: Normal skin turgor, no visible rash  and no visible skin lesions.  Neuro/Psych:. Mood and affect are appropriate.   Assessment  He has a significant phimosis. He would like to proceed with surgery for correction of his phimosis and we therefore discussed the procedure in detail. I went over the incision used, the risks and complications of this form of surgery as well as the probability of success in the outpatient nature of the procedure. He asked if it would result in increased sensitivity I told him that because he is unable to retract the foreskin now that it would result in improved sensitivity of the glans compared to the current situation with his phimosis.  We then discussed the fact that I could not guarantee that the intraurethral suppositories would be effective for him and so what I suggested is a test injection of the same medication, prostaglandin E 1. This would allow me to determine if he is going to respond to prostaglandin and if it requires a high dose then that would also indicate that the intraurethral suppositories would be less likely to be effective. It would also allow him to determine if this is something like to continue using for his erectile dysfunction as the cost may be less as well. We discussed proceeding with this once he has completely healed from his circumcision.   Plan He is scheduled for circumcision as an outpatient.

## 2013-06-06 ENCOUNTER — Encounter (HOSPITAL_BASED_OUTPATIENT_CLINIC_OR_DEPARTMENT_OTHER): Payer: Self-pay | Admitting: Urology

## 2013-06-06 ENCOUNTER — Telehealth: Payer: Self-pay

## 2013-06-08 NOTE — Telephone Encounter (Signed)
received call from pt pcp.pt recent carotid duplex reflect carotid stensis at 69% and pt needs f/u.new pt appt made with Dr.Arida. pt is also a pt of Dr.Smith seen for CAD and has not been seen since 2013.pt aware appt made with Dr.Arida at 9:30am and Dr.Smith at 11:30 on 07/11/13.pt aware of new location.pt verbalized understanding.

## 2013-07-01 ENCOUNTER — Encounter (HOSPITAL_COMMUNITY): Payer: Self-pay | Admitting: Emergency Medicine

## 2013-07-01 ENCOUNTER — Emergency Department (HOSPITAL_COMMUNITY)
Admission: EM | Admit: 2013-07-01 | Discharge: 2013-07-01 | Disposition: A | Discharge status: Home / Self Care | Payer: Medicare Other | Attending: Emergency Medicine | Admitting: Emergency Medicine

## 2013-07-01 DIAGNOSIS — R300 Dysuria: Secondary | ICD-10-CM | POA: Insufficient documentation

## 2013-07-01 DIAGNOSIS — K5641 Fecal impaction: Secondary | ICD-10-CM

## 2013-07-01 DIAGNOSIS — Z79899 Other long term (current) drug therapy: Secondary | ICD-10-CM | POA: Insufficient documentation

## 2013-07-01 DIAGNOSIS — Z9089 Acquired absence of other organs: Secondary | ICD-10-CM | POA: Insufficient documentation

## 2013-07-01 DIAGNOSIS — I209 Angina pectoris, unspecified: Secondary | ICD-10-CM | POA: Insufficient documentation

## 2013-07-01 DIAGNOSIS — M19049 Primary osteoarthritis, unspecified hand: Secondary | ICD-10-CM | POA: Insufficient documentation

## 2013-07-01 DIAGNOSIS — Z7982 Long term (current) use of aspirin: Secondary | ICD-10-CM | POA: Insufficient documentation

## 2013-07-01 DIAGNOSIS — M129 Arthropathy, unspecified: Secondary | ICD-10-CM | POA: Insufficient documentation

## 2013-07-01 DIAGNOSIS — R34 Anuria and oliguria: Secondary | ICD-10-CM | POA: Insufficient documentation

## 2013-07-01 DIAGNOSIS — R143 Flatulence: Secondary | ICD-10-CM

## 2013-07-01 DIAGNOSIS — Z85118 Personal history of other malignant neoplasm of bronchus and lung: Secondary | ICD-10-CM | POA: Insufficient documentation

## 2013-07-01 DIAGNOSIS — G8929 Other chronic pain: Secondary | ICD-10-CM | POA: Insufficient documentation

## 2013-07-01 DIAGNOSIS — K5900 Constipation, unspecified: Secondary | ICD-10-CM | POA: Insufficient documentation

## 2013-07-01 DIAGNOSIS — Z9889 Other specified postprocedural states: Secondary | ICD-10-CM | POA: Insufficient documentation

## 2013-07-01 DIAGNOSIS — E785 Hyperlipidemia, unspecified: Secondary | ICD-10-CM | POA: Insufficient documentation

## 2013-07-01 DIAGNOSIS — I10 Essential (primary) hypertension: Secondary | ICD-10-CM | POA: Insufficient documentation

## 2013-07-01 DIAGNOSIS — K625 Hemorrhage of anus and rectum: Secondary | ICD-10-CM | POA: Insufficient documentation

## 2013-07-01 DIAGNOSIS — Z85038 Personal history of other malignant neoplasm of large intestine: Secondary | ICD-10-CM | POA: Insufficient documentation

## 2013-07-01 DIAGNOSIS — I2510 Atherosclerotic heart disease of native coronary artery without angina pectoris: Secondary | ICD-10-CM | POA: Insufficient documentation

## 2013-07-01 DIAGNOSIS — Z86718 Personal history of other venous thrombosis and embolism: Secondary | ICD-10-CM | POA: Insufficient documentation

## 2013-07-01 DIAGNOSIS — E119 Type 2 diabetes mellitus without complications: Secondary | ICD-10-CM | POA: Insufficient documentation

## 2013-07-01 DIAGNOSIS — Z951 Presence of aortocoronary bypass graft: Secondary | ICD-10-CM | POA: Insufficient documentation

## 2013-07-01 DIAGNOSIS — Z8505 Personal history of malignant neoplasm of liver: Secondary | ICD-10-CM | POA: Insufficient documentation

## 2013-07-01 DIAGNOSIS — Z8659 Personal history of other mental and behavioral disorders: Secondary | ICD-10-CM | POA: Insufficient documentation

## 2013-07-01 DIAGNOSIS — R142 Eructation: Secondary | ICD-10-CM

## 2013-07-01 DIAGNOSIS — R339 Retention of urine, unspecified: Secondary | ICD-10-CM | POA: Insufficient documentation

## 2013-07-01 DIAGNOSIS — Z794 Long term (current) use of insulin: Secondary | ICD-10-CM | POA: Insufficient documentation

## 2013-07-01 DIAGNOSIS — R141 Gas pain: Secondary | ICD-10-CM | POA: Insufficient documentation

## 2013-07-01 DIAGNOSIS — Z880 Allergy status to penicillin: Secondary | ICD-10-CM | POA: Insufficient documentation

## 2013-07-01 LAB — URINALYSIS, ROUTINE W REFLEX MICROSCOPIC
Bilirubin Urine: NEGATIVE
Glucose, UA: 500 mg/dL — AB
Hgb urine dipstick: NEGATIVE
KETONES UR: NEGATIVE mg/dL
Leukocytes, UA: NEGATIVE
NITRITE: NEGATIVE
PH: 5 (ref 5.0–8.0)
Protein, ur: NEGATIVE mg/dL
Specific Gravity, Urine: 1.02 (ref 1.005–1.030)
UROBILINOGEN UA: 0.2 mg/dL (ref 0.0–1.0)

## 2013-07-01 LAB — I-STAT CHEM 8, ED
BUN: 20 mg/dL (ref 6–23)
CHLORIDE: 102 meq/L (ref 96–112)
Calcium, Ion: 1.16 mmol/L (ref 1.13–1.30)
Creatinine, Ser: 1 mg/dL (ref 0.50–1.35)
Glucose, Bld: 205 mg/dL — ABNORMAL HIGH (ref 70–99)
HCT: 46 % (ref 39.0–52.0)
Hemoglobin: 15.6 g/dL (ref 13.0–17.0)
POTASSIUM: 4.9 meq/L (ref 3.7–5.3)
Sodium: 136 mEq/L — ABNORMAL LOW (ref 137–147)
TCO2: 25 mmol/L (ref 0–100)

## 2013-07-01 MED ORDER — FLEET ENEMA 7-19 GM/118ML RE ENEM
1.0000 | ENEMA | Freq: Once | RECTAL | Status: AC
  Administered 2013-07-01: 1 via RECTAL
  Filled 2013-07-01: qty 1

## 2013-07-01 NOTE — ED Notes (Signed)
Patient has been taking Murelax at home with no success. He states he had a BM yesterday and it was just a small amount and it was very hard. He states he has difficulty urinating.

## 2013-07-01 NOTE — ED Provider Notes (Signed)
Complains of inability to urinate or have bowel movement since yesterday. No vomiting no abdominal pain . He feels mild pressure in abdomen. On exam alert no distress lungs clear auscultation abdomen nondistended nontender genitalia normal male  Orlie Dakin, MD 07/01/13 1219

## 2013-07-01 NOTE — Discharge Instructions (Signed)
Continue taking mirilax and home stool softener until bowel movements become soft and regular.  Follow up with urology this week for further evaluation and treatment of urinary retention.  Follow up as scheduled for your CT on Friday.  Return to ER for NEW or worsening symptoms including fever, nausea, vomiting, or increased pain.   See below for further instruction.   Constipation, Adult Constipation is when a person has fewer than 3 bowel movements a week; has difficulty having a bowel movement; or has stools that are dry, hard, or larger than normal. As people grow older, constipation is more common. If you try to fix constipation with medicines that make you have a bowel movement (laxatives), the problem may get worse. Long-term laxative use may cause the muscles of the colon to become weak. A low-fiber diet, not taking in enough fluids, and taking certain medicines may make constipation worse. CAUSES   Certain medicines, such as antidepressants, pain medicine, iron supplements, antacids, and water pills.   Certain diseases, such as diabetes, irritable bowel syndrome (IBS), thyroid disease, or depression.   Not drinking enough water.   Not eating enough fiber-rich foods.   Stress or travel.  Lack of physical activity or exercise.  Not going to the restroom when there is the urge to have a bowel movement.  Ignoring the urge to have a bowel movement.  Using laxatives too much. SYMPTOMS   Having fewer than 3 bowel movements a week.   Straining to have a bowel movement.   Having hard, dry, or larger than normal stools.   Feeling full or bloated.   Pain in the lower abdomen.  Not feeling relief after having a bowel movement. DIAGNOSIS  Your caregiver will take a medical history and perform a physical exam. Further testing may be done for severe constipation. Some tests may include:   A barium enema X-ray to examine your rectum, colon, and sometimes, your small  intestine.  A sigmoidoscopy to examine your lower colon.  A colonoscopy to examine your entire colon. TREATMENT  Treatment will depend on the severity of your constipation and what is causing it. Some dietary treatments include drinking more fluids and eating more fiber-rich foods. Lifestyle treatments may include regular exercise. If these diet and lifestyle recommendations do not help, your caregiver may recommend taking over-the-counter laxative medicines to help you have bowel movements. Prescription medicines may be prescribed if over-the-counter medicines do not work.  HOME CARE INSTRUCTIONS   Increase dietary fiber in your diet, such as fruits, vegetables, whole grains, and beans. Limit high-fat and processed sugars in your diet, such as [place] fries, hamburgers, cookies, candies, and soda.   A fiber supplement may be added to your diet if you cannot get enough fiber from foods.   Drink enough fluids to keep your urine clear or pale yellow.   Exercise regularly or as directed by your caregiver.   Go to the restroom when you have the urge to go. Do not hold it.  Only take medicines as directed by your caregiver. Do not take other medicines for constipation without talking to your caregiver first. [place] IF:   You have bright red blood in your stool.   Your constipation lasts for more than 4 days or gets worse.   You have abdominal or rectal pain.   You have thin, pencil-like stools.  You have unexplained weight loss. MAKE SURE YOU:   Understand these instructions.  Will watch your condition.  Will  get help right away if you are not doing well or get worse. Document Released: 11/29/2003 Document Revised: 05/25/2011 Document Reviewed: 12/12/2012 [facility] Patient Information 2014 [place], [place].

## 2013-07-01 NOTE — ED Notes (Signed)
Pt presents with c/o unable to urinate.  Pt states he has not urinated since yesterday.  Pt reports he is diabetic and that he usually urinates several times a day.  Pt also reports unable to have BM for 2 days.  Pt states he had very hard stool initially and has taken laxatives.  Pt states he usually has a BM daily.  Pt denies any diet/med changes.  Pt with hx of stage 4 colon cancer (5-6 years ago).

## 2013-07-01 NOTE — ED Notes (Signed)
Patient shows with bladder scanner >261ml. Patient given urnal to attempt to urinate.

## 2013-07-01 NOTE — ED Provider Notes (Signed)
CSN: [id number]     Arrival date & time 07/01/13  1128 History   First MD Initiated Contact with Patient 07/01/13 1139     Chief Complaint  Patient presents with  . Urinary Retention  . Constipation     (Consider location/radiation/quality/duration/timing/severity/associated sxs/prior Treatment) HPI Pt is a 73yo male with hx of stage IV colon cancer that required partial removal of colon and liver, no recurrence for at least 5-6 years, c/o constipation and urinary retention x2 days.  Pt states he had a hard, large BM 3 days ago but since then he has not been able to have a BM or void. Pt c/o lower abdominal pain and rectal pain.  Abdominal pain is constant, aching pressure, 5/64 worse in certain positions. Pt also c/o rectal pain that is aching and sharp, 8/10, associated with scant red blood pt believes due to straining. Has tried laxatives w/o relief.  Denies fever, n/v/d. Pt is concerned as he has been constipated before, however, states he's never had difficulty urinating.  Reports having previously scheduled CT scan for next Friday to f/u with oncology.    Past Medical History  Diagnosis Date  . Hypertension   . Depression   . Type II diabetes mellitus   . Chronic lower back pain 1960's - present (11/12/2011)  . Hyperlipidemia   . History of DVT of lower extremity     2000--  LEFT LEG  . History of secondary liver cancer     METS FROM COLON CANCER--  S/P LOBECTOMY( 11/2008) AND RADIOFREQUENCY ABLATION FOR 3 LESIONS  (08/2003)  . H/O colon cancer, stage IV ONCOLOGIST--  DR Marin Olp---  NO RECURRENCE  PER PT    DX 2006  colorectal with mets to liver, lung----  S/P RIGHT COLECTOMY/  LIVER LOBECTOMY FOR METS/  CHEMOTHERAPY (10 CYCLES 10/2008  &  8 CYCLES 10/2009)   and RADIATION THERAPY  . GERD (gastroesophageal reflux disease)   . Arthritis     "back and left hand  . Coronary artery disease     CARDIOLOGIST--  DR Mallie Mussel SMITH (LOV  10-05-2011)  . Lung metastasis     STEREOTACTIC BODY  RADIOTHERPY  54Gy IN 3 FRACTIONS RIGHT LUNG  COMPLETED 10-26-2009  (TOTAL DOSES  5400 cGy  in three fractions at 1800 cGy per fraction)  . S/P CABG x 5     2000  . History of angina     chronic angina   Past Surgical History  Procedure Laterality Date  . Portacath placement  last one  2010  . Carpal tunnel release Right 05-29-2008  . Posterior fusion lumbar spine  11/12/2011    L4  -- L5  . Cataract extraction w/ intraocular lens  implant, bilateral    . Shoulder arthroscopy/ acromioplasty/  rotator cuff repair/  open distal clavical  excision Right 06-26-2010  . Coronary artery bypass graft  2000  DR VAN TRIGHT    5 VESSEL/  LIMA  to LAD/  SVG to OM1/  SVG to  DIAGONAL ONE/  SEQUENTIAL SVG to PDA/  PLOM  . Cardiac catheterization  02-08-2007  DR Mallie Mussel SMITH    60-70% SVG to [facility]  . Repair ventral insicional hernia with mesh  03-31-2007  . Cardiovascular stress test  09-30-2011  DR Livingston STUDY BUT UNCHANGED FROM PRIOR STUDY/  SYMPTOMS ARE STABLE  . Right hemicolecotmy/  elective cholecystectomy/  liver bx's  2006  . Right partial hepatecotmy wedge resection  11/2008   [facility]  . Circumcision N/A 06/05/2013    Procedure: CIRCUMCISION ADULT;  Surgeon: Claybon Jabs, MD;  Location: [facility];  Service: Urology;  Laterality: N/A;   No family history on file. History  Substance Use Topics  . Smoking status: Never Smoker   . Smokeless tobacco: Never Used  . Alcohol Use: No    Review of Systems  Constitutional: Negative for fever and chills.  Cardiovascular: Negative for chest pain and palpitations.  Gastrointestinal: Positive for abdominal pain, constipation, blood in stool and anal bleeding ( small amount "from straining?"). Negative for nausea, vomiting and diarrhea.  Genitourinary: Positive for decreased urine volume and difficulty urinating. Negative for dysuria, frequency and flank pain.       Unable to void x2 days  Musculoskeletal:  Negative for back pain.  All other systems reviewed and are negative.     Allergies  Amoxicillin and Lipitor  Home Medications   Prior to Admission medications   Medication Sig Start Date End Date Taking? Authorizing Provider  acetaminophen (TYLENOL) 650 MG CR tablet Take 650 mg by mouth every morning. And prn    Historical Provider, MD  aspirin EC 81 MG tablet Take 81 mg by mouth every morning.     Historical Provider, MD  calcium citrate (CALCITRATE - DOSED IN MG ELEMENTAL CALCIUM) 950 MG tablet Take 200 mg of elemental calcium by mouth daily.    Historical Provider, MD  Cholecalciferol (VITAMIN D3) 1000 UNITS CAPS Take 1 capsule by mouth daily.     Historical Provider, MD  fish oil-omega-3 fatty acids 1000 MG capsule Take 1 g by mouth every morning.    Historical Provider, MD  glipiZIDE (GLUCOTROL) 5 MG tablet Take 5 mg by mouth 2 (two) times daily before a meal.    Historical Provider, MD  Glucosamine-Chondroit-Vit C-Mn (GLUCOSAMINE 1500 COMPLEX PO) Take 1 tablet by mouth daily.    Historical Provider, MD  insulin glargine (LANTUS) 100 UNIT/ML injection Inject 50 Units into the skin every morning.     Historical Provider, MD  lisinopril (PRINIVIL,ZESTRIL) 5 MG tablet Take 5 mg by mouth every morning.    Historical Provider, MD  Melatonin 5 MG CAPS Take by mouth at bedtime as needed.    Historical Provider, MD  metFORMIN (GLUCOPHAGE) 1000 MG tablet Take 1,000 mg by mouth 2 (two) times daily with a meal.    Historical Provider, MD  Multiple Vitamin (MULTIVITAMIN WITH MINERALS) TABS Take 1 tablet by mouth daily.    Historical Provider, MD  Omega-3 Fatty Acids (FISH OIL) 1000 MG CAPS Take 2 capsules by mouth daily.    Historical Provider, MD  Oxycodone HCl 10 MG TABS Take 1 tablet (10 mg total) by mouth every 4 (four) hours as needed. 06/05/13   Claybon Jabs, MD  rosuvastatin (CRESTOR) 5 MG tablet Take 5 mg by mouth every morning.    Historical Provider, MD  vitamin B-12 (CYANOCOBALAMIN)  1000 MCG tablet Take 1,000 mcg by mouth every morning.     Historical Provider, MD  vitamin E 400 UNIT capsule Take 800 Units by mouth daily.    Historical Provider, MD   BP 147/86  Pulse 90  Temp(Src) 97.9 F (36.6 C) (Oral)  Resp 17  Wt 196 lb (88.905 kg)  SpO2 97% Physical Exam  Nursing note and vitals reviewed. Constitutional: He appears well-developed and well-nourished.  HENT:  Head: Normocephalic and atraumatic.  Eyes: Conjunctivae are normal. No scleral icterus.  Neck:  Normal range of motion.  Cardiovascular: Normal rate, regular rhythm and normal heart sounds.   Pulmonary/Chest: Effort normal and breath sounds normal. No respiratory distress. He has no wheezes. He has no rales. He exhibits no tenderness.  Abdominal: Soft. Bowel sounds are normal. He exhibits distension ( mild). He exhibits no mass. There is tenderness (over bladder). There is no rebound and no guarding.  Genitourinary: Rectal exam shows mass ( large fecal impaction) and tenderness. Rectal exam shows no external hemorrhoid, no internal hemorrhoid, no fissure and anal tone normal. Guaiac positive stool ( scant bright red blood on glove).  Chaperoned exam.  Fecal impaction present.  Musculoskeletal: Normal range of motion.  Neurological: He is alert.  Skin: Skin is warm and dry.    ED Course  Procedures (including critical care time) Labs Review Labs Reviewed  URINALYSIS, ROUTINE W REFLEX MICROSCOPIC - Abnormal; Notable for the following:    Glucose, UA 500 (*)    All other components within normal limits  I-STAT CHEM 8, ED - Abnormal; Notable for the following:    Sodium 136 (*)    Glucose, Bld 205 (*)    All other components within normal limits    Imaging Review No results found.   EKG Interpretation None      MDM   Final diagnoses:  Urinary retention  Constipation  Fecal impaction in rectum    Pt with hx of stage 4 colon cancer (5-6 years ago) c/o constipation, rectal pain, and  urinary retention x2 days.  Denies fever, n/v/d. On exam, lower abdomen is mildly distended over lower abdomen.  Rectal exam: fecal impaction present, small amount of stool able to be manually disimpacted.    Discussed pt with Dr. Winfred Leeds.  istat Chem-8 and UA ordered. Foley placed after bladder scan recorded >261ml.   After foley placed, pt stated he felt much better in his lower abdomen, however still feels constipated.  Enema given, resulted in small amount of hard stool.    Labs: Chem-8: mild hyperglycemia-205 UA-unremarkable, no evidence of UTI.    Foley bag was emptied while pt in ED-1300cc urine.    Discussed pt with Dr. Winfred Leeds, pt may be discharged home and will keep foley in place. Advised to f/u with his urologist, provided contact information for Dr. Tresa Moore, urology as well if needed.  Advised to f/u as scheduled for CT abd on Friday, 4/24 as previously scheduled. Also advised to continue using home stool softeners as prescribed. Return precautions provided. Pt verbalized understanding and agreement with tx plan.    Noland Fordyce, PA-C 07/01/13 [place], PA-C 07/01/13 1513

## 2013-07-01 NOTE — ED Provider Notes (Signed)
Medical screening examination/treatment/procedure(s) were conducted as a shared visit with non-physician practitioner(s) and myself.  I personally evaluated the patient during the encounter.   EKG Interpretation None       Orlie Dakin, MD 07/01/13 6060

## 2013-07-11 ENCOUNTER — Encounter: Payer: Self-pay | Admitting: Cardiovascular Disease

## 2013-07-11 ENCOUNTER — Encounter: Payer: Self-pay | Admitting: Interventional Cardiology

## 2013-07-11 ENCOUNTER — Ambulatory Visit (INDEPENDENT_AMBULATORY_CARE_PROVIDER_SITE_OTHER): Payer: Medicare Other | Admitting: Interventional Cardiology

## 2013-07-11 ENCOUNTER — Ambulatory Visit (INDEPENDENT_AMBULATORY_CARE_PROVIDER_SITE_OTHER): Payer: Medicare Other | Admitting: Cardiovascular Disease

## 2013-07-11 VITALS — BP 113/60 | HR 81 | Ht 67.0 in | Wt 196.0 lb

## 2013-07-11 VITALS — BP 120/68 | HR 84 | Ht 67.0 in | Wt 196.8 lb

## 2013-07-11 DIAGNOSIS — E785 Hyperlipidemia, unspecified: Secondary | ICD-10-CM

## 2013-07-11 DIAGNOSIS — E119 Type 2 diabetes mellitus without complications: Secondary | ICD-10-CM

## 2013-07-11 DIAGNOSIS — I6529 Occlusion and stenosis of unspecified carotid artery: Secondary | ICD-10-CM

## 2013-07-11 DIAGNOSIS — I2510 Atherosclerotic heart disease of native coronary artery without angina pectoris: Secondary | ICD-10-CM

## 2013-07-11 DIAGNOSIS — I10 Essential (primary) hypertension: Secondary | ICD-10-CM

## 2013-07-11 DIAGNOSIS — I25709 Atherosclerosis of coronary artery bypass graft(s), unspecified, with unspecified angina pectoris: Secondary | ICD-10-CM | POA: Insufficient documentation

## 2013-07-11 DIAGNOSIS — E118 Type 2 diabetes mellitus with unspecified complications: Secondary | ICD-10-CM | POA: Insufficient documentation

## 2013-07-11 DIAGNOSIS — I6522 Occlusion and stenosis of left carotid artery: Secondary | ICD-10-CM

## 2013-07-11 NOTE — Progress Notes (Signed)
Primary care physician: Dr. Bernerd Limbo  Primary cardiologist: Dr. Daneen Schick  HPI  This is a pleasant 73 year old man who was referred for evaluation of asymptomatic left carotid stenosis. He has no previous history of stroke. He has known history of coronary artery disease with remote CABG, type II day pedis, hypertension and hyperlipidemia. He was found recently to have a left carotid bruit. Carotid duplex ultrasound was done at [facility] which showed no significant stenosis on the right side with a peak velocity of 68. On the left side, and atherosclerotic plaque was noted proximally in the internal carotid artery with a peak velocity of 381 end-diastolic velocity of 43. This was suggestive of a 50-69% stenosis. He reports chronic symptoms of exertional chest pain and dyspnea.  Allergies  Allergen Reactions  . Amoxicillin Anaphylaxis and Other (See Comments)    Stops breathing  . Lipitor [Atorvastatin] Other (See Comments)    "Aches all over, really badly."     Current Outpatient Prescriptions on File Prior to Visit  Medication Sig Dispense Refill  . acetaminophen (TYLENOL) 650 MG CR tablet Take 650 mg by mouth every morning. And prn      . aspirin EC 81 MG tablet Take 81 mg by mouth every morning.       . calcium citrate (CALCITRATE - DOSED IN MG ELEMENTAL CALCIUM) 950 MG tablet Take 200 mg of elemental calcium by mouth daily.      . Cholecalciferol (VITAMIN D3) 1000 UNITS CAPS Take 1 capsule by mouth daily.       . fish oil-omega-3 fatty acids 1000 MG capsule Take 1 g by mouth every morning.      Marland Kitchen glipiZIDE (GLUCOTROL) 5 MG tablet Take 5 mg by mouth 2 (two) times daily before a meal.      . Glucosamine-Chondroit-Vit C-Mn (GLUCOSAMINE 1500 COMPLEX PO) Take 1 tablet by mouth daily.      . insulin glargine (LANTUS) 100 UNIT/ML injection Inject 50 Units into the skin every morning.       Marland Kitchen lisinopril (PRINIVIL,ZESTRIL) 5 MG tablet Take 5 mg by mouth every morning.      . metFORMIN  (GLUCOPHAGE) 1000 MG tablet Take 1,000 mg by mouth 2 (two) times daily with a meal.      . Multiple Vitamin (MULTIVITAMIN WITH MINERALS) TABS Take 1 tablet by mouth daily.      . Oxycodone HCl 10 MG TABS Take 1 tablet (10 mg total) by mouth every 4 (four) hours as needed.  30 tablet  0  . rosuvastatin (CRESTOR) 5 MG tablet Take 5 mg by mouth every morning.      . vitamin B-12 (CYANOCOBALAMIN) 1000 MCG tablet Take 1,000 mcg by mouth every morning.       . vitamin E 400 UNIT capsule Take 800 Units by mouth daily.       No current facility-administered medications on file prior to visit.     Past Medical History  Diagnosis Date  . Hypertension   . Depression   . Type II diabetes mellitus   . Chronic lower back pain 1960's - present (11/12/2011)  . Hyperlipidemia   . History of DVT of lower extremity     2000--  LEFT LEG  . History of secondary liver cancer     METS FROM COLON CANCER--  S/P LOBECTOMY( 11/2008) AND RADIOFREQUENCY ABLATION FOR 3 LESIONS  (08/2003)  . H/O colon cancer, stage IV ONCOLOGIST--  DR Marin Olp---  NO RECURRENCE  PER PT  DX 2006  colorectal with mets to liver, lung----  S/P RIGHT COLECTOMY/  LIVER LOBECTOMY FOR METS/  CHEMOTHERAPY (10 CYCLES 10/2008  &  8 CYCLES 10/2009)   and RADIATION THERAPY  . GERD (gastroesophageal reflux disease)   . Arthritis     "back and left hand  . Coronary artery disease     CARDIOLOGIST--  DR Mallie Mussel SMITH (LOV  10-05-2011)  . Lung metastasis     STEREOTACTIC BODY RADIOTHERPY  54Gy IN 3 FRACTIONS RIGHT LUNG  COMPLETED 10-26-2009  (TOTAL DOSES  5400 cGy  in three fractions at 1800 cGy per fraction)  . S/P CABG x 5     2000  . History of angina     chronic angina     Past Surgical History  Procedure Laterality Date  . Portacath placement  last one  2010  . Carpal tunnel release Right 05-29-2008  . Posterior fusion lumbar spine  11/12/2011    L4  -- L5  . Cataract extraction w/ intraocular lens  implant, bilateral    . Shoulder  arthroscopy/ acromioplasty/  rotator cuff repair/  open distal clavical  excision Right 06-26-2010  . Coronary artery bypass graft  2000  DR VAN TRIGHT    5 VESSEL/  LIMA  to LAD/  SVG to OM1/  SVG to  DIAGONAL ONE/  SEQUENTIAL SVG to PDA/  PLOM  . Cardiac catheterization  02-08-2007  DR Mallie Mussel SMITH    60-70% SVG to [facility]  . Repair ventral insicional hernia with mesh  03-31-2007  . Cardiovascular stress test  09-30-2011  DR Granby STUDY BUT UNCHANGED FROM PRIOR STUDY/  SYMPTOMS ARE STABLE  . Right hemicolecotmy/  elective cholecystectomy/  liver bx's  2006  . Right partial hepatecotmy wedge resection  11/2008   [facility]  . Circumcision N/A 06/05/2013    Procedure: CIRCUMCISION ADULT;  Surgeon: Claybon Jabs, MD;  Location: [facility];  Service: Urology;  Laterality: N/A;     No family history on file.   History   Social History  . Marital Status: Divorced    Spouse Name: N/A    Number of Children: N/A  . Years of Education: N/A   Occupational History  . Not on file.   Social History Main Topics  . Smoking status: Never Smoker   . Smokeless tobacco: Never Used  . Alcohol Use: No  . Drug Use: No  . Sexual Activity: Not on file   Other Topics Concern  . Not on file   Social History Narrative  . No narrative on file     ROS A 10 point review of system was performed. It is negative other than that mentioned in the history of present illness.   PHYSICAL EXAM   BP 120/68  Pulse 84  Ht 5\' 7"  (1.702 m)  Wt 196 lb 12.8 oz (89.268 kg)  BMI 30.82 kg/m2  SpO2 97% Constitutional: He is oriented to person, place, and time. He appears well-developed and well-nourished. No distress.  HENT: No nasal discharge.  Head: Normocephalic and atraumatic.  Eyes: Pupils are equal and round.  No discharge. Neck: Normal range of motion. Neck supple. No JVD present. No thyromegaly present.  Cardiovascular: Normal rate, regular rhythm, normal heart  sounds. Exam reveals no gallop and no friction rub. No murmur heard.  Pulmonary/Chest: Effort normal and breath sounds normal. No stridor. No respiratory distress. He has no wheezes. He has no rales. He  exhibits no tenderness.  Abdominal: Soft. Bowel sounds are normal. He exhibits no distension. There is no tenderness. There is no rebound and no guarding.  Musculoskeletal: Normal range of motion. He exhibits no edema and no tenderness.  Neurological: He is alert and oriented to person, place, and time. Coordination normal.  Skin: Skin is warm and dry. No rash noted. He is not diaphoretic. No erythema. No pallor.  Psychiatric: He has a normal mood and affect. His behavior is normal. Judgment and thought content normal.        ASSESSMENT AND PLAN

## 2013-07-11 NOTE — Assessment & Plan Note (Signed)
Continue treatment with a statin with a target LDL of less than 70. 

## 2013-07-11 NOTE — Assessment & Plan Note (Signed)
The patient has asymptomatic left carotid stenosis. It was reported 50-69% ( according to our criteria it is in the low range of 60-79%). Revascularization for asymptomatic carotid stenosis is not recommended unless the stenosis is greater than 80%. Continue aspirin daily as well as aggressive control of risk factors. I recommend a followup carotid Doppler in 6 months from now to ensure stability. The patient will be placed in our surveillance program.

## 2013-07-11 NOTE — Patient Instructions (Signed)
Your physician has requested that you have a carotid duplex in 6 MONTHS. This test is an ultrasound of the carotid arteries in your neck. It looks at blood flow through these arteries that supply the brain with blood. Allow one hour for this exam. There are no restrictions or special instructions.  Your physician recommends that you schedule a follow-up appointment as needed with Dr Fletcher Anon.  Your physician recommends that you continue on your current medications as directed. Please refer to the Current Medication list given to you today.

## 2013-07-11 NOTE — Progress Notes (Signed)
Patient ID: [name], male   DOB: [birth year], 73 y.o.   MRN: [id number]    [address], [address] [place],   [address] Phone: [phone] Fax:  [phone]  Date:  07/11/2013   ID:  [name], DOB 06-22-1940, MRN [id number]  PCP:  Berkley Harvey, NP   ASSESSMENT:  1. Coronary artery disease with prior bypass surgery, and stable intermittent chest discomfort that may be angina. He does not use nitroglycerin because episodes are so brief. 2. Hyperlipidemia on therapy 3. Hypertension 4. Hepatic/colon cancer  PLAN:  1. no specific change or evaluation from cardiac standpoint with patient seems to be stable. 2. Maintain an active lifestyle 3. Clinical followup in one year unless angina or CV symptoms occur   SUBJECTIVE: [name] is a 73 y.o. male who has occasional chest discomfort. It is random. Episodes last less than 5 minutes. He has not needed nitroglycerin. He characterizes the discomfort as in frequent and somewhat pressure-like. There is no dyspnea. No palpitations or syncope.    Wt Readings from Last 3 Encounters:  07/11/13 196 lb (88.905 kg)  07/11/13 196 lb 12.8 oz (89.268 kg)  07/01/13 196 lb (88.905 kg)     Past Medical History  Diagnosis Date  . Hypertension   . Depression   . Type II diabetes mellitus   . Chronic lower back pain 1960's - present (11/12/2011)  . Hyperlipidemia   . History of DVT of lower extremity     2000--  LEFT LEG  . History of secondary liver cancer     METS FROM COLON CANCER--  S/P LOBECTOMY( 11/2008) AND RADIOFREQUENCY ABLATION FOR 3 LESIONS  (08/2003)  . H/O colon cancer, stage IV ONCOLOGIST--  DR Marin Olp---  NO RECURRENCE  PER PT    DX 2006  colorectal with mets to liver, lung----  S/P RIGHT COLECTOMY/  LIVER LOBECTOMY FOR METS/  CHEMOTHERAPY (10 CYCLES 10/2008  &  8 CYCLES 10/2009)   and RADIATION THERAPY  . GERD (gastroesophageal reflux disease)   . Arthritis     "back and left hand  . Coronary  artery disease     CARDIOLOGIST--  DR Mallie Mussel Aaira Oestreicher (LOV  10-05-2011)  . Lung metastasis     STEREOTACTIC BODY RADIOTHERPY  54Gy IN 3 FRACTIONS RIGHT LUNG  COMPLETED 10-26-2009  (TOTAL DOSES  5400 cGy  in three fractions at 1800 cGy per fraction)  . S/P CABG x 5     2000  . History of angina     chronic angina    Current Outpatient Prescriptions  Medication Sig Dispense Refill  . acetaminophen (TYLENOL) 650 MG CR tablet Take 650 mg by mouth every morning. And prn      . aspirin EC 81 MG tablet Take 81 mg by mouth every morning.       . calcium citrate (CALCITRATE - DOSED IN MG ELEMENTAL CALCIUM) 950 MG tablet Take 200 mg of elemental calcium by mouth daily.      . Cholecalciferol (VITAMIN D3) 1000 UNITS CAPS Take 1 capsule by mouth daily.       . fish oil-omega-3 fatty acids 1000 MG capsule Take 1 g by mouth every morning.      Marland Kitchen glipiZIDE (GLUCOTROL) 5 MG tablet Take 5 mg by mouth 2 (two) times daily before a meal.      . Glucosamine-Chondroit-Vit C-Mn (GLUCOSAMINE 1500 COMPLEX PO) Take 1 tablet by mouth daily.      Marland Kitchen  insulin glargine (LANTUS) 100 UNIT/ML injection Inject 50 Units into the skin every morning.       Marland Kitchen lisinopril (PRINIVIL,ZESTRIL) 5 MG tablet Take 5 mg by mouth every morning.      . metFORMIN (GLUCOPHAGE) 1000 MG tablet Take 1,000 mg by mouth 2 (two) times daily with a meal.      . Multiple Vitamin (MULTIVITAMIN WITH MINERALS) TABS Take 1 tablet by mouth daily.      . Oxycodone HCl 10 MG TABS Take 1 tablet (10 mg total) by mouth every 4 (four) hours as needed.  30 tablet  0  . rosuvastatin (CRESTOR) 5 MG tablet Take 5 mg by mouth every morning.      . vitamin B-12 (CYANOCOBALAMIN) 1000 MCG tablet Take 1,000 mcg by mouth every morning.       . vitamin E 400 UNIT capsule Take 800 Units by mouth daily.       No current facility-administered medications for this visit.    Allergies:    Allergies  Allergen Reactions  . Amoxicillin Anaphylaxis and Other (See Comments)     Stops breathing  . Lipitor [Atorvastatin] Other (See Comments)    "Aches all over, really badly."    Social History:  The patient  reports that he has never smoked. He has never used smokeless tobacco. He reports that he does not drink alcohol or use illicit drugs.   ROS:  Please see the history of present illness   All other systems reviewed and negative.   OBJECTIVE: VS:  BP 113/60  Pulse 81  Ht 5\' 7"  (1.702 m)  Wt 196 lb (88.905 kg)  BMI 30.69 kg/m2 Well nourished, well developed, in no acute distress, appears stated age f HEENT: normal Neck: JVD flat. Carotid bruit absent bruit although we do know that plaque is present on Doppler studies  Cardiac:  normal S1, S2; RRR; no murmur Lungs:  clear to auscultation bilaterally, no wheezing, rhonchi or rales Abd: soft, nontender, no hepatomegaly Ext: Edema absent. Pulses 2+ Skin: warm and dry Neuro:  CNs 2-12 intact, no focal abnormalities noted  EKG:  ECG done in March revealed normal sinus rhythm with left axis deviation and nonspecific ST abnormality. No acute abnormality or change from prior       Signed, Jasper, MD 07/11/2013 11:32 AM

## 2013-07-11 NOTE — Patient Instructions (Signed)
Your physician recommends that you continue on your current medications as directed. Please refer to the Current Medication list given to you today.  Your physician wants you to follow-up in: 1 year. You will receive a reminder letter in the mail two months in advance. If you don't receive a letter, please call our office to schedule the follow-up appointment.  

## 2013-07-11 NOTE — Assessment & Plan Note (Signed)
He reports exertional chest pain and dyspnea. He has a followup appointment with Dr. Tamala Julian. He is known to have graft disease.

## 2013-12-18 ENCOUNTER — Ambulatory Visit (HOSPITAL_COMMUNITY): Payer: Medicare Other | Attending: Internal Medicine | Admitting: *Deleted

## 2013-12-18 DIAGNOSIS — I6523 Occlusion and stenosis of bilateral carotid arteries: Secondary | ICD-10-CM

## 2013-12-18 DIAGNOSIS — R202 Paresthesia of skin: Secondary | ICD-10-CM

## 2013-12-18 DIAGNOSIS — Z951 Presence of aortocoronary bypass graft: Secondary | ICD-10-CM | POA: Diagnosis not present

## 2013-12-18 DIAGNOSIS — R0989 Other specified symptoms and signs involving the circulatory and respiratory systems: Secondary | ICD-10-CM

## 2013-12-18 DIAGNOSIS — I6529 Occlusion and stenosis of unspecified carotid artery: Secondary | ICD-10-CM

## 2013-12-18 DIAGNOSIS — E119 Type 2 diabetes mellitus without complications: Secondary | ICD-10-CM | POA: Insufficient documentation

## 2013-12-18 DIAGNOSIS — I10 Essential (primary) hypertension: Secondary | ICD-10-CM | POA: Insufficient documentation

## 2013-12-18 DIAGNOSIS — I2510 Atherosclerotic heart disease of native coronary artery without angina pectoris: Secondary | ICD-10-CM | POA: Insufficient documentation

## 2013-12-18 DIAGNOSIS — E785 Hyperlipidemia, unspecified: Secondary | ICD-10-CM | POA: Insufficient documentation

## 2013-12-18 NOTE — Progress Notes (Signed)
Carotid duplex complete 

## 2013-12-27 ENCOUNTER — Telehealth: Payer: Self-pay | Admitting: *Deleted

## 2013-12-27 DIAGNOSIS — I6522 Occlusion and stenosis of left carotid artery: Secondary | ICD-10-CM

## 2013-12-27 NOTE — Telephone Encounter (Signed)
Message copied by Tracie Harrier on Wed Dec 27, 2013 11:00 AM ------      Message from: Kathlyn Sacramento A      Created: Wed Dec 27, 2013 10:04 AM       Moderate left carotid stenosis. This seems to be better than previously reported. Recommend a follow up carotid doppler in 1 year. ------

## 2014-07-29 ENCOUNTER — Emergency Department (HOSPITAL_COMMUNITY): Payer: Medicare Other

## 2014-07-29 ENCOUNTER — Observation Stay (HOSPITAL_COMMUNITY)
Admission: EM | Admit: 2014-07-29 | Discharge: 2014-07-30 | Disposition: A | Discharge status: Home / Self Care | Payer: Medicare Other | Attending: Cardiology | Admitting: Cardiology

## 2014-07-29 ENCOUNTER — Encounter (HOSPITAL_COMMUNITY): Payer: Self-pay | Admitting: Emergency Medicine

## 2014-07-29 DIAGNOSIS — I2510 Atherosclerotic heart disease of native coronary artery without angina pectoris: Secondary | ICD-10-CM | POA: Diagnosis not present

## 2014-07-29 DIAGNOSIS — Z86718 Personal history of other venous thrombosis and embolism: Secondary | ICD-10-CM | POA: Diagnosis not present

## 2014-07-29 DIAGNOSIS — M1990 Unspecified osteoarthritis, unspecified site: Secondary | ICD-10-CM | POA: Insufficient documentation

## 2014-07-29 DIAGNOSIS — F329 Major depressive disorder, single episode, unspecified: Secondary | ICD-10-CM | POA: Insufficient documentation

## 2014-07-29 DIAGNOSIS — I200 Unstable angina: Secondary | ICD-10-CM

## 2014-07-29 DIAGNOSIS — G8929 Other chronic pain: Secondary | ICD-10-CM | POA: Diagnosis not present

## 2014-07-29 DIAGNOSIS — Z8505 Personal history of malignant neoplasm of liver: Secondary | ICD-10-CM | POA: Insufficient documentation

## 2014-07-29 DIAGNOSIS — Z85038 Personal history of other malignant neoplasm of large intestine: Secondary | ICD-10-CM | POA: Diagnosis not present

## 2014-07-29 DIAGNOSIS — Z79899 Other long term (current) drug therapy: Secondary | ICD-10-CM | POA: Insufficient documentation

## 2014-07-29 DIAGNOSIS — Z951 Presence of aortocoronary bypass graft: Secondary | ICD-10-CM | POA: Diagnosis not present

## 2014-07-29 DIAGNOSIS — K219 Gastro-esophageal reflux disease without esophagitis: Secondary | ICD-10-CM | POA: Diagnosis not present

## 2014-07-29 DIAGNOSIS — M542 Cervicalgia: Secondary | ICD-10-CM

## 2014-07-29 DIAGNOSIS — E785 Hyperlipidemia, unspecified: Secondary | ICD-10-CM | POA: Diagnosis not present

## 2014-07-29 DIAGNOSIS — G5600 Carpal tunnel syndrome, unspecified upper limb: Secondary | ICD-10-CM | POA: Diagnosis not present

## 2014-07-29 DIAGNOSIS — E119 Type 2 diabetes mellitus without complications: Secondary | ICD-10-CM | POA: Insufficient documentation

## 2014-07-29 DIAGNOSIS — Z794 Long term (current) use of insulin: Secondary | ICD-10-CM | POA: Diagnosis not present

## 2014-07-29 DIAGNOSIS — R079 Chest pain, unspecified: Secondary | ICD-10-CM | POA: Diagnosis present

## 2014-07-29 DIAGNOSIS — Z85118 Personal history of other malignant neoplasm of bronchus and lung: Secondary | ICD-10-CM | POA: Diagnosis not present

## 2014-07-29 DIAGNOSIS — I10 Essential (primary) hypertension: Secondary | ICD-10-CM | POA: Insufficient documentation

## 2014-07-29 DIAGNOSIS — I25110 Atherosclerotic heart disease of native coronary artery with unstable angina pectoris: Principal | ICD-10-CM | POA: Insufficient documentation

## 2014-07-29 DIAGNOSIS — M5030 Other cervical disc degeneration, unspecified cervical region: Secondary | ICD-10-CM | POA: Diagnosis not present

## 2014-07-29 DIAGNOSIS — Z7982 Long term (current) use of aspirin: Secondary | ICD-10-CM | POA: Diagnosis not present

## 2014-07-29 DIAGNOSIS — Z880 Allergy status to penicillin: Secondary | ICD-10-CM | POA: Insufficient documentation

## 2014-07-29 HISTORY — DX: Other chronic pain: G89.29

## 2014-07-29 HISTORY — DX: Carpal tunnel syndrome, unspecified upper limb: G56.00

## 2014-07-29 HISTORY — DX: Cervicalgia: M54.2

## 2014-07-29 HISTORY — DX: Radiculopathy, cervical region: M54.12

## 2014-07-29 HISTORY — DX: Dorsalgia, unspecified: M54.9

## 2014-07-29 HISTORY — DX: Unspecified disorder of synovium and tendon, unspecified shoulder: M67.919

## 2014-07-29 LAB — GLUCOSE, CAPILLARY
Glucose-Capillary: 116 mg/dL — ABNORMAL HIGH (ref 65–99)
Glucose-Capillary: 185 mg/dL — ABNORMAL HIGH (ref 65–99)

## 2014-07-29 LAB — CBC
HEMATOCRIT: 43.8 % (ref 39.0–52.0)
Hemoglobin: 14.7 g/dL (ref 13.0–17.0)
MCH: 28.4 pg (ref 26.0–34.0)
MCHC: 33.6 g/dL (ref 30.0–36.0)
MCV: 84.6 fL (ref 78.0–100.0)
Platelets: 181 10*3/uL (ref 150–400)
RBC: 5.18 MIL/uL (ref 4.22–5.81)
RDW: 13.5 % (ref 11.5–15.5)
WBC: 5.9 10*3/uL (ref 4.0–10.5)

## 2014-07-29 LAB — BASIC METABOLIC PANEL
Anion gap: 10 (ref 5–15)
BUN: 14 mg/dL (ref 6–20)
CALCIUM: 9 mg/dL (ref 8.9–10.3)
CHLORIDE: 102 mmol/L (ref 101–111)
CO2: 24 mmol/L (ref 22–32)
Creatinine, Ser: 1.01 mg/dL (ref 0.61–1.24)
GFR calc Af Amer: >60 mL/min (ref >60)
Glucose, Bld: 220 mg/dL — ABNORMAL HIGH (ref 65–99)
POTASSIUM: 4.1 mmol/L (ref 3.5–5.1)
SODIUM: 136 mmol/L (ref 135–145)

## 2014-07-29 LAB — TROPONIN I: Troponin I: <0.03 ng/mL (ref <0.031)

## 2014-07-29 LAB — HEPARIN LEVEL (UNFRACTIONATED): Heparin Unfractionated: 0.26 IU/mL — ABNORMAL LOW (ref 0.30–0.70)

## 2014-07-29 LAB — I-STAT TROPONIN, ED: TROPONIN I, POC: 0 ng/mL (ref 0.00–0.08)

## 2014-07-29 MED ORDER — ASPIRIN 81 MG PO CHEW: 324.0000 mg | CHEWABLE_TABLET | ORAL | Status: DC

## 2014-07-29 MED ORDER — ASPIRIN 300 MG RE SUPP: 300.0000 mg | RECTAL | Status: DC

## 2014-07-29 MED ORDER — HEPARIN (PORCINE) IN NACL 100-0.45 UNIT/ML-% IJ SOLN
1350.0000 [IU]/h | INTRAMUSCULAR | Status: DC
  Administered 2014-07-29: 1150 [IU]/h via INTRAVENOUS
  Administered 2014-07-30: 1350 [IU]/h via INTRAVENOUS
  Filled 2014-07-29 (×2): qty 250

## 2014-07-29 MED ORDER — NITROGLYCERIN 0.4 MG SL SUBL: 0.4000 mg | SUBLINGUAL_TABLET | SUBLINGUAL | Status: DC | PRN

## 2014-07-29 MED ORDER — HEPARIN BOLUS VIA INFUSION
4000.0000 [IU] | Freq: Once | INTRAVENOUS | Status: AC
  Administered 2014-07-29: 4000 [IU] via INTRAVENOUS
  Filled 2014-07-29: qty 4000

## 2014-07-29 MED ORDER — NITROGLYCERIN 2 % TD OINT
1.0000 [in_us] | TOPICAL_OINTMENT | Freq: Once | TRANSDERMAL | Status: DC
  Filled 2014-07-29: qty 1

## 2014-07-29 MED ORDER — NITROGLYCERIN 2 % TD OINT
0.5000 [in_us] | TOPICAL_OINTMENT | Freq: Once | TRANSDERMAL | Status: AC
  Administered 2014-07-29: 0.5 [in_us] via TOPICAL

## 2014-07-29 MED ORDER — ONDANSETRON HCL 4 MG/2ML IJ SOLN: 4.0000 mg | Freq: Four times a day (QID) | INTRAMUSCULAR | Status: DC | PRN

## 2014-07-29 MED ORDER — MORPHINE SULFATE 4 MG/ML IJ SOLN
4.0000 mg | INTRAMUSCULAR | Status: AC | PRN
  Administered 2014-07-29 (×2): 4 mg via INTRAVENOUS
  Filled 2014-07-29 (×2): qty 1

## 2014-07-29 MED ORDER — ACETAMINOPHEN 325 MG PO TABS: 650.0000 mg | ORAL_TABLET | ORAL | Status: DC | PRN

## 2014-07-29 MED ORDER — SODIUM CHLORIDE 0.9 % IV SOLN
INTRAVENOUS | Status: DC
  Administered 2014-07-29 – 2014-07-30 (×2): via INTRAVENOUS

## 2014-07-29 MED ORDER — ROSUVASTATIN CALCIUM 20 MG PO TABS
20.0000 mg | ORAL_TABLET | Freq: Every day | ORAL | Status: DC
  Administered 2014-07-29: 20 mg via ORAL
  Filled 2014-07-29: qty 1

## 2014-07-29 MED ORDER — ASPIRIN EC 81 MG PO TBEC
81.0000 mg | DELAYED_RELEASE_TABLET | Freq: Every day | ORAL | Status: DC
  Administered 2014-07-30: 81 mg via ORAL
  Filled 2014-07-29: qty 1

## 2014-07-29 MED ORDER — SODIUM CHLORIDE 0.9 % IV BOLUS (SEPSIS)
500.0000 mL | Freq: Once | INTRAVENOUS | Status: AC
  Administered 2014-07-29: 500 mL via INTRAVENOUS

## 2014-07-29 MED ORDER — METOPROLOL TARTRATE 12.5 MG HALF TABLET
12.5000 mg | ORAL_TABLET | Freq: Two times a day (BID) | ORAL | Status: DC
  Administered 2014-07-29 – 2014-07-30 (×2): 12.5 mg via ORAL
  Filled 2014-07-29 (×2): qty 1

## 2014-07-29 MED ORDER — NITROGLYCERIN 2 % TD OINT
0.5000 [in_us] | TOPICAL_OINTMENT | Freq: Four times a day (QID) | TRANSDERMAL | Status: DC
  Filled 2014-07-29: qty 30

## 2014-07-29 MED ORDER — OXYCODONE-ACETAMINOPHEN 5-325 MG PO TABS
1.0000 | ORAL_TABLET | Freq: Four times a day (QID) | ORAL | Status: DC | PRN
  Administered 2014-07-29 – 2014-07-30 (×2): 2 via ORAL
  Filled 2014-07-29 (×2): qty 2

## 2014-07-29 NOTE — H&P (Signed)
CARDIOLOGY HISTORY AND PHYSICAL   Patient ID: [name] MRN: [id number]  DOB/AGE: [birth year] 74 y.o. Admit date: 07/29/2014  Primary Care Physician: Berkley Harvey, NP Primary Cardiologist: Olen Pel MD  Clinical Summary [name] is a 74 y.o.male with known history of CAD, s/p CABG, with history of stable intermittent chest discomfort, hypertension, carotid artery disease (left carotid, seen by Dr. Fletcher Anon on 07/11/2013 no intervention at that time, hyperlipidemia, with complaints of intermittent chest pain and left shoulder and with radiculopathy to the neck and hands. He admits to having this pain on and off for years but this was more intense. He is worried that he is blocking back up his coronary arteries. He has not seen Dr. Tamala Julian in one year and had stopped taking his medications for several months, but restarted a few weeks ago.    He also states he was doing some heavy lifting and twisted his back and left shoulder, while cleaning out a rental property. He is describing two different symptoms. Intermittent chest discomfort that has been worsening over the last few weeks, and musculoskeletal pain which has been constant for 4 days with radiculopathy.    On arrival to ER  BP 113/50, HR 98, afebrile CXR negative for CHF, CT of head and neck negative for acute pathology. Troponin negative X1. Glucose 220. EKG NSR with T-wave abnormality in the lateral leads, with left atrial enlargement. He has been treated with Morphine, started on heparin infusion and NTG paste has been placed.  He is pain free in his chest, but continues to have pain in left shoulder with diminished ROM.   Allergies  Allergen Reactions  . Amoxicillin Anaphylaxis, Other (See Comments) and Shortness Of Breath    Stops breathing  . Lipitor [Atorvastatin] Other (See Comments)    Joint and muscle pain "Aches all over, really badly."    Home Medications  (Not in a hospital admission)  Scheduled  Medications    Infusions . sodium chloride 100 mL/hr at 07/29/14 1354  . heparin 1,150 Units/hr (07/29/14 1520)    PRN Medications morphine injection  Past Medical History  Diagnosis Date  . Hypertension   . Depression   . Type II diabetes mellitus   . Chronic lower back pain 1960's - present (11/12/2011)  . Hyperlipidemia   . History of DVT of lower extremity     2000--  LEFT LEG  . History of secondary liver cancer     METS FROM COLON CANCER--  S/P LOBECTOMY( 11/2008) AND RADIOFREQUENCY ABLATION FOR 3 LESIONS  (08/2003)  . H/O colon cancer, stage IV ONCOLOGIST--  DR Marin Olp---  NO RECURRENCE  PER PT    DX 2006  colorectal with mets to liver, lung----  S/P RIGHT COLECTOMY/  LIVER LOBECTOMY FOR METS/  CHEMOTHERAPY (10 CYCLES 10/2008  &  8 CYCLES 10/2009)   and RADIATION THERAPY  . GERD (gastroesophageal reflux disease)   . Coronary artery disease     CARDIOLOGIST--  DR Mallie Mussel SMITH (LOV  10-05-2011)  . Lung metastasis     STEREOTACTIC BODY RADIOTHERPY  54Gy IN 3 FRACTIONS RIGHT LUNG  COMPLETED 10-26-2009  (TOTAL DOSES  5400 cGy  in three fractions at 1800 cGy per fraction)  . S/P CABG x 5     2000  . History of angina     chronic angina  . Cervical radiculopathy     left  . Arthritis     "back and left hand  .  Chronic neck and back pain   . Carpal tunnel syndrome   . Rotator cuff disorder     right    Past Surgical History  Procedure Laterality Date  . Portacath placement  last one  2010  . Carpal tunnel release Right 05-29-2008  . Posterior fusion lumbar spine  11/12/2011    L4  -- L5  . Cataract extraction w/ intraocular lens  implant, bilateral    . Shoulder arthroscopy/ acromioplasty/  rotator cuff repair/  open distal clavical  excision Right 06-26-2010  . Coronary artery bypass graft  2000  DR VAN TRIGHT    5 VESSEL/  LIMA  to LAD/  SVG to OM1/  SVG to  DIAGONAL ONE/  SEQUENTIAL SVG to PDA/  PLOM  . Cardiac catheterization  02-08-2007  DR Mallie Mussel SMITH    60-70% SVG  to [facility]  . Repair ventral insicional hernia with mesh  03-31-2007  . Cardiovascular stress test  09-30-2011  DR [place] STUDY BUT UNCHANGED FROM PRIOR STUDY/  SYMPTOMS ARE STABLE  . Right hemicolecotmy/  elective cholecystectomy/  liver bx's  2006  . Right partial hepatecotmy wedge resection  11/2008   [facility]  . Circumcision N/A 06/05/2013    Procedure: CIRCUMCISION ADULT;  Surgeon: Claybon Jabs, MD;  Location: [facility];  Service: Urology;  Laterality: N/A;    Family History  Problem Relation Age of Onset  . Other Mother   . Other Father     Social History [name] reports that he has never smoked. He has never used smokeless tobacco. [name] reports that he does not drink alcohol.  Review of Systems Patient denies fever, chills, headache, sweats, rash, change in vision, change in hearing, cough, nausea or vomiting, urinary symptoms. All other systems are reviewed and are negative.  Physical Examination Temp:  [97.6 F (36.4 C)] 97.6 F (36.4 C) (05/15 1154) Pulse Rate:  [77-98] 77 (05/15 1500) Resp:  [12-18] 12 (05/15 1500) BP: (95-124)/(50-65) 124/65 mmHg (05/15 1500) SpO2:  [89 %-96 %] 96 % (05/15 1500) Weight:  [205 lb (92.987 kg)] 205 lb (92.987 kg) (05/15 1154)  Intake/Output Summary (Last 24 hours) at 07/29/14 1527 Last data filed at 07/29/14 1352  Gross per 24 hour  Intake    500 ml  Output      0 ml  Net    500 ml    Gen: No acute distress. HEENT: Conjunctiva and lids normal, oropharynx clear with moist mucosa. Neck: Supple, no elevated JVP or carotid bruits, no thyromegaly. Lungs: Clear to auscultation, nonlabored breathing at rest. Cardiac: Regular rate and rhythm, no S3 or significant systolic murmur, no pericardial rub. Abdomen: Soft, nontender, no hepatomegaly, bowel sounds present, no guarding or rebound. Extremities: No pitting edema, distal pulses 2+. Skin: Warm and dry. Musculoskeletal: No  kyphosis. Neuropsychiatric: Alert and oriented x3, affect grossly appropriate.   Lab Results  Basic Metabolic Panel:  Recent Labs Lab 07/29/14 1215  NA 136  K 4.1  CL 102  CO2 24  GLUCOSE 220*  BUN 14  CREATININE 1.01  CALCIUM 9.0    CBC:  Recent Labs Lab 07/29/14 1215  WBC 5.9  HGB 14.7  HCT 43.8  MCV 84.6  PLT 181    Cardiac Enzymes: No results for input(s): CKTOTAL, CKMB, CKMBINDEX, TROPONINI in the last 168 hours.  BNP: Invalid input(s): POCBNP   Radiology Dg Chest 2 View  07/29/2014   CLINICAL DATA:  Chest pain and left shoulder pain 1 week metastatic lung cancer.  EXAM: CHEST  2 VIEW  COMPARISON:  11/04/2011  FINDINGS: Sternotomy wires are unchanged. Right IJ Port-A-Cath is unchanged. Lungs are adequately inflated with linear scarring over the anterior midlung on the lateral film. There is no consolidation or effusion. No focal nodule or masses cardiomediastinal silhouette is within normal. There are multiple surgical clips along the left hilar region and mediastinum. There is calcified plaque over the aortic arch. There are degenerative changes of the spine.  IMPRESSION: No acute cardiopulmonary disease.   Electronically Signed   By: Marin Olp M.D.   On: 07/29/2014 14:02   Ct Head Wo Contrast  07/29/2014   CLINICAL DATA:  Left shoulder pain for 1 week  EXAM: CT HEAD WITHOUT CONTRAST  CT CERVICAL SPINE WITHOUT CONTRAST  TECHNIQUE: Multidetector CT imaging of the head and cervical spine was performed following the standard protocol without intravenous contrast. Multiplanar CT image reconstructions of the cervical spine were also generated.  COMPARISON:  None.  FINDINGS: CT HEAD FINDINGS  There is no mass effect, midline shift, or acute hemorrhage. Vasculature is calcified and somewhat hyperdense bilaterally. Global atrophy. Mild chronic ischemic changes in the periventricular white matter. Mastoid air cells are clear. Cranium is intact  CT CERVICAL SPINE FINDINGS   No acute fracture. No dislocation. No obvious spinal hematoma or soft tissue injury.  There is advanced disc space narrowing at C4-5, C5-6, and C6-7 with posterior osteophytic ridging and bilateral uncovertebral osteophytes.  IMPRESSION: No acute intracranial pathology. No evidence of cervical spine injury.   Electronically Signed   By: Marybelle Killings M.D.   On: 07/29/2014 13:47   Ct Cervical Spine Wo Contrast  07/29/2014   CLINICAL DATA:  Left shoulder pain for 1 week  EXAM: CT HEAD WITHOUT CONTRAST  CT CERVICAL SPINE WITHOUT CONTRAST  TECHNIQUE: Multidetector CT imaging of the head and cervical spine was performed following the standard protocol without intravenous contrast. Multiplanar CT image reconstructions of the cervical spine were also generated.  COMPARISON:  None.  FINDINGS: CT HEAD FINDINGS  There is no mass effect, midline shift, or acute hemorrhage. Vasculature is calcified and somewhat hyperdense bilaterally. Global atrophy. Mild chronic ischemic changes in the periventricular white matter. Mastoid air cells are clear. Cranium is intact  CT CERVICAL SPINE FINDINGS  No acute fracture. No dislocation. No obvious spinal hematoma or soft tissue injury.  There is advanced disc space narrowing at C4-5, C5-6, and C6-7 with posterior osteophytic ridging and bilateral uncovertebral osteophytes.  IMPRESSION: No acute intracranial pathology. No evidence of cervical spine injury.   Electronically Signed   By: Marybelle Killings M.D.   On: 07/29/2014 13:47   Dg Shoulder Left  07/29/2014   CLINICAL DATA:  Left shoulder and chest pain for 1 week  EXAM: LEFT SHOULDER - 2+ VIEW  COMPARISON:  None.  FINDINGS: No acute fracture. No dislocation. Moderate degenerative changes of the glenohumeral and AC joints.  IMPRESSION: No acute bone pathology.   Electronically Signed   By: Marybelle Killings M.D.   On: 07/29/2014 14:01    Prior Cardiac Testing/Procedures:   1. Cardiac Cath 02/08/2007 1. Patent saphenous vein grafts  with the exception of 60-70% stenosis  in the distal graft insertion into the right coronary left  ventricular branch. This is probably not a hemodynamically  significant lesion, although there is no way to be absolutely sure  this distal lesion could account for the inferolateral wall  ischemia. 2. Patent left internal mammary artery to the left anterior  descending. 3. Severe native vessel coronary disease with total occlusion of the  circumflex, severe high-grade obstruction in the left main and mid  and proximal left anterior descending, total occlusion of the  posterior descending artery and distal left ventricular branch of  the right coronary native vessel. There is also 70-80% stenosis in  the patent native right coronary, both before and after the origin  of the posterior descending artery itself. The circumflex  territory likely accounts for much of the ischemia noted on the  Cardiolite. The vessel beyond the graft insertion site is severely  and diffusely diseased and probably is causing the ischemia. 4. Normal left ventricular function.  PLAN: Aggressive medical therapy.  ECG: NSR with T-wave abnormalities in the lateral leads, rate of 94 bpm.    Impression and Recommendations  1. Chest Pain with known CAD: Has been having intermittent chest pain for several years, but has had worsening of symptoms for the last month. He had stopped taking his medications for several months, but restarted the last few weeks. He is now chest pain free on NTG paste and on heparin gtt. Will need to admit with possible cardiac cath this week. Will defer to Dr. Tamala Julian to make recommendations. In the interim will continue NTG paste, ACE, heparin, will add low dose lopressor for angina relief, lopressor 12.5 mg BID with parameters for HR and BP.   2. CAD: Has not been seen by Dr. Tamala Julian since April of 2015. He states he was to be  seen annually. Will restart cardiac medications and cycle cardiac enzymes. Recheck EKG in am. Check lipid study and restart statin, ASA, ACE and BB as discussed above.   3. Diabetes: Restart metformin and glipized. May need sliding scale insulin coverage. Stop metformin if cath is planned.   4. Left shoulder and neck pain: Worsening with decreased ROM. Possible orthopedic evaluation once cardiac issues are sorted out.   Signed: Phill Myron. [place] NP [place]  07/29/2014, 3:27 PM Patient seen and examined. I agree with the assessment and plan as detailed above. See also my additional thoughts below.   The patient appears to have 2 different pains. One is a musculoskeletal pain probably related to cervical disc disease. The other pain is ischemic pain. He has been having pain with exertion. He had stopped his medicines but then restarted them. He has had continued increase in exertional chest discomfort. Now he has had some at rest. EKG does not show any diagnostic change. He is admitted for enzyme evaluation. With this presentation he will need in-hospital assessment. It'll be helpful to get input from Dr. Tamala Julian who saw him as an outpatient last one year ago.  Dola Argyle, MD, [facility] 07/29/2014 4:05 PM

## 2014-07-29 NOTE — ED Notes (Signed)
3W APPT @ 1620

## 2014-07-29 NOTE — Progress Notes (Signed)
ANTICOAGULATION CONSULT NOTE - Follow Up Consult  Pharmacy Consult for Heparin  Indication: chest pain/ACS  Allergies  Allergen Reactions  . Amoxicillin Anaphylaxis, Other (See Comments) and Shortness Of Breath    Stops breathing  . Lipitor [Atorvastatin] Other (See Comments)    Joint and muscle pain "Aches all over, really badly."    Patient Measurements: Height: 5\' 7"  (170.2 cm) Weight: 204 lb 3.2 oz (92.625 kg) IBW/kg (Calculated) : 66.1  Vital Signs: Temp: 97.7 F (36.5 C) (05/15 2029) Temp Source: Oral (05/15 2029) BP: 136/68 mmHg (05/15 2029) Pulse Rate: 72 (05/15 2029)  Labs:  Recent Labs  07/29/14 1215 07/29/14 1556 07/29/14 2235  HGB 14.7  --   --   HCT 43.8  --   --   PLT 181  --   --   HEPARINUNFRC  --   --  0.26*  CREATININE 1.01  --   --   TROPONINI  --  <0.03  --     Estimated Creatinine Clearance: 71.7 mL/min (by C-G formula based on Cr of 1.01).   Assessment: Sub-therapeutic heparin level, no cath plans for now, no issues per RN.   Goal of Therapy:  Heparin level 0.3-0.7 units/ml Monitor platelets by anticoagulation protocol: Yes   Plan:  -Increase heparin to 1350 units/hr -0700 HL -Daily CBC/HL -Monitor for bleeding  Narda Bonds 07/29/2014,11:07 PM

## 2014-07-29 NOTE — ED Notes (Signed)
Patient transported to X-ray 

## 2014-07-29 NOTE — Progress Notes (Signed)
ANTICOAGULATION CONSULT NOTE - Initial Consult  Pharmacy Consult for Heparin  Indication: chest pain/ACS  Allergies  Allergen Reactions  . Amoxicillin Anaphylaxis, Other (See Comments) and Shortness Of Breath    Stops breathing  . Lipitor [Atorvastatin] Other (See Comments)    Joint and muscle pain "Aches all over, really badly."    Patient Measurements: Height: 5\' 7"  (170.2 cm) Weight: 205 lb (92.987 kg) IBW/kg (Calculated) : 66.1 Heparin Dosing Weight: 86 kg   Vital Signs: Temp: 97.6 F (36.4 C) (05/15 1154) Temp Source: Oral (05/15 1154) BP: 109/61 mmHg (05/15 1333) Pulse Rate: 87 (05/15 1230)  Labs:  Recent Labs  07/29/14 1215  HGB 14.7  HCT 43.8  PLT 181  CREATININE 1.01    Estimated Creatinine Clearance: 71.9 mL/min (by C-G formula based on Cr of 1.01).   Medical History: Past Medical History  Diagnosis Date  . Hypertension   . Depression   . Type II diabetes mellitus   . Chronic lower back pain 1960's - present (11/12/2011)  . Hyperlipidemia   . History of DVT of lower extremity     2000--  LEFT LEG  . History of secondary liver cancer     METS FROM COLON CANCER--  S/P LOBECTOMY( 11/2008) AND RADIOFREQUENCY ABLATION FOR 3 LESIONS  (08/2003)  . H/O colon cancer, stage IV ONCOLOGIST--  DR Marin Olp---  NO RECURRENCE  PER PT    DX 2006  colorectal with mets to liver, lung----  S/P RIGHT COLECTOMY/  LIVER LOBECTOMY FOR METS/  CHEMOTHERAPY (10 CYCLES 10/2008  &  8 CYCLES 10/2009)   and RADIATION THERAPY  . GERD (gastroesophageal reflux disease)   . Coronary artery disease     CARDIOLOGIST--  DR Mallie Mussel SMITH (LOV  10-05-2011)  . Lung metastasis     STEREOTACTIC BODY RADIOTHERPY  54Gy IN 3 FRACTIONS RIGHT LUNG  COMPLETED 10-26-2009  (TOTAL DOSES  5400 cGy  in three fractions at 1800 cGy per fraction)  . S/P CABG x 5     2000  . History of angina     chronic angina  . Cervical radiculopathy     left  . Arthritis     "back and left hand  . Chronic neck and  back pain   . Carpal tunnel syndrome   . Rotator cuff disorder     right    Medications:   (Not in a hospital admission)  Assessment: 51 YOM with intermittent episodes of chest pain. In the ED, initial troponin is negative. Pharmacy is consulted to start IV heparin for ACS. CBC wnl. He is not on any anticoagulation prior to admission   Goal of Therapy:  Heparin level 0.3-0.7 units/ml Monitor platelets by anticoagulation protocol: Yes   Plan:  -Heparin 4000 units bolus followed by heparin infusion at 1150 units/hr -F/u 8 hr HL  -Monitor CBC, daily HL, and s/s of bleeding  Albertina Parr, PharmD., BCPS Clinical Pharmacist Pager [phone]

## 2014-07-29 NOTE — ED Notes (Signed)
Pt. Stated, I've been having left shoulder pain for a week to the point I can't move the arm.I've also been having chest pain for a week.

## 2014-07-29 NOTE — ED Provider Notes (Signed)
CSN: [id number]     Arrival date & time 07/29/14  1146 History   First MD Initiated Contact with Patient 07/29/14 1217     Chief Complaint  Patient presents with  . Shoulder Pain  . Chest Pain      HPI Pt was seen at 1215. Per pt, c/o gradual onset and worsening of multiple intermittent episodes of chest "pain" for the past 1 month. Pt describes the pain as "heaviness" and "pressure" in his chest, worsens with exertion and improves with resting after approximately 20 - 30 min.  Pt states "it feels like my heart pain." States he was told by his Cards MD last year "that my heart grafts were closing up" and "I would probably need some stents."  Pt has not been taking his ntg for this discomfort "because it was going away when I rest."  Pt states the episodes are "coming on more frequently" and are now occurring at rest. Pt currently c/o mild chest discomfort upon arrival to the ED while he is resting on the stretcher. Pt also endorses he has not been taking his medications for the past several months. Denies any other associated symptoms. Denies palpitations, no SOB/cough, no abd pain, no N/V/D, no back pain, no rash, no fevers. Pt also c/o gradual onset and persistence of constant left sided neck "pain" for the past 1+ week. Pt describes the pain as "stabbing" and "throbbing." Pain worsens with palpation of the area, body position changes, and left arm movement. Pt has hx of neck pain with radiculopathy. Denies injury, no rash, no fevers, no headache, no focal motor weakness, no tingling/numbness in extremities.    Past Medical History  Diagnosis Date  . Hypertension   . Depression   . Type II diabetes mellitus   . Chronic lower back pain 1960's - present (11/12/2011)  . Hyperlipidemia   . History of DVT of lower extremity     2000--  LEFT LEG  . History of secondary liver cancer     METS FROM COLON CANCER--  S/P LOBECTOMY( 11/2008) AND RADIOFREQUENCY ABLATION FOR 3 LESIONS  (08/2003)  . H/O  colon cancer, stage IV ONCOLOGIST--  DR Marin Olp---  NO RECURRENCE  PER PT    DX 2006  colorectal with mets to liver, lung----  S/P RIGHT COLECTOMY/  LIVER LOBECTOMY FOR METS/  CHEMOTHERAPY (10 CYCLES 10/2008  &  8 CYCLES 10/2009)   and RADIATION THERAPY  . GERD (gastroesophageal reflux disease)   . Coronary artery disease     CARDIOLOGIST--  DR Mallie Mussel SMITH (LOV  10-05-2011)  . Lung metastasis     STEREOTACTIC BODY RADIOTHERPY  54Gy IN 3 FRACTIONS RIGHT LUNG  COMPLETED 10-26-2009  (TOTAL DOSES  5400 cGy  in three fractions at 1800 cGy per fraction)  . S/P CABG x 5     2000  . History of angina     chronic angina  . Cervical radiculopathy     left  . Arthritis     "back and left hand  . Chronic neck and back pain   . Carpal tunnel syndrome   . Rotator cuff disorder     right   Past Surgical History  Procedure Laterality Date  . Portacath placement  last one  2010  . Carpal tunnel release Right 05-29-2008  . Posterior fusion lumbar spine  11/12/2011    L4  -- L5  . Cataract extraction w/ intraocular lens  implant, bilateral    . Shoulder arthroscopy/  acromioplasty/  rotator cuff repair/  open distal clavical  excision Right 06-26-2010  . Coronary artery bypass graft  2000  DR VAN TRIGHT    5 VESSEL/  LIMA  to LAD/  SVG to OM1/  SVG to  DIAGONAL ONE/  SEQUENTIAL SVG to PDA/  PLOM  . Cardiac catheterization  02-08-2007  DR Mallie Mussel SMITH    60-70% SVG to [facility]  . Repair ventral insicional hernia with mesh  03-31-2007  . Cardiovascular stress test  09-30-2011  DR [place] STUDY BUT UNCHANGED FROM PRIOR STUDY/  SYMPTOMS ARE STABLE  . Right hemicolecotmy/  elective cholecystectomy/  liver bx's  2006  . Right partial hepatecotmy wedge resection  11/2008   [facility]  . Circumcision N/A 06/05/2013    Procedure: CIRCUMCISION ADULT;  Surgeon: Claybon Jabs, MD;  Location: [facility];  Service: Urology;  Laterality: N/A;   Family History  Problem Relation Age  of Onset  . Other Mother   . Other Father    History  Substance Use Topics  . Smoking status: Never Smoker   . Smokeless tobacco: Never Used  . Alcohol Use: No    Review of Systems ROS: Statement: All systems negative except as marked or noted in the HPI; Constitutional: Negative for fever and chills. ; ; Eyes: Negative for eye pain, redness and discharge. ; ; ENMT: Negative for ear pain, hoarseness, nasal congestion, sinus pressure and sore throat. ; ; Cardiovascular: +CP. Negative for palpitations, diaphoresis, dyspnea and peripheral edema. ; ; Respiratory: Negative for cough, wheezing and stridor. ; ; Gastrointestinal: Negative for nausea, vomiting, diarrhea, abdominal pain, blood in stool, hematemesis, jaundice and rectal bleeding. . ; ; Genitourinary: Negative for dysuria, flank pain and hematuria. ; ; Musculoskeletal: +left neck pain. Negative for back pain. Negative for swelling and trauma.; ; Skin: Negative for pruritus, rash, abrasions, blisters, bruising and skin lesion.; ; Neuro: Negative for headache, lightheadedness and neck stiffness. Negative for weakness, altered level of consciousness , altered mental status, extremity weakness, paresthesias, involuntary movement, seizure and syncope.      Allergies  Amoxicillin and Lipitor  Home Medications   Prior to Admission medications   Medication Sig Start Date End Date Taking? Authorizing Provider  acetaminophen (TYLENOL) 650 MG CR tablet Take 650 mg by mouth every morning. And prn    Historical Provider, MD  aspirin 325 MG tablet Take by mouth. 03/03/12   Historical Provider, MD  aspirin EC 81 MG tablet Take 81 mg by mouth every morning.     Historical Provider, MD  calcium citrate (CALCITRATE - DOSED IN MG ELEMENTAL CALCIUM) 950 MG tablet Take 200 mg of elemental calcium by mouth daily.    Historical Provider, MD  Cholecalciferol (VITAMIN D-1000 MAX ST) 1000 UNITS tablet Take by mouth.    Historical Provider, MD  Cobalamine  Combinations (VITAMIN B12-FOLIC ACID) 782-956 MCG TABS Take by mouth.    Historical Provider, MD  fish oil-omega-3 fatty acids 1000 MG capsule Take 1 g by mouth every morning.    Historical Provider, MD  glipiZIDE (GLUCOTROL) 5 MG tablet Take 5 mg by mouth 2 (two) times daily before a meal.    Historical Provider, MD  Glucosamine-Chondroit-Vit C-Mn (GLUCOSAMINE 1500 COMPLEX PO) Take 1 tablet by mouth daily.    Historical Provider, MD  glucose blood ([facility]S BLOOD GLUCOSE TEST STRIPS) test strip Frequency:three times daily or as directed   Dosage:0.0     Instructions:OneTouch Test, 1  Strip three times daily or as directed  Note:one touch ultra 2 strips 05/13/12   Historical Provider, MD  glyBURIDE (DIABETA) 5 MG tablet Take by mouth. 03/03/12   Historical Provider, MD  Insulin Glargine (LANTUS SOLOSTAR) 100 UNIT/ML Solostar Pen Inject into the skin. 06/09/13   Historical Provider, MD  insulin glargine (LANTUS) 100 UNIT/ML injection Inject 50 Units into the skin every morning.     Historical Provider, MD  Insulin Pen Needle (NOVOFINE) 30G X 8 MM MISC Frequency:as directed with lantus/ insulin pen   Dosage:30   G X 8 MM  Instructions:NovoFine 30G X 8 MM, 1 pen needle pen needle as directed with lantus/ insulin pen  Note: 04/13/12   Historical Provider, MD  Insulin Syringe-Needle U-100 (INSULIN SYRINGE 1CC/30GX5/16") 30G X 5/16" 1 ML MISC Frequency:to inject inuslin bid as directed   Dosage:30G X 5/16"   1 ML  Instructions:Insulin Syringe 30G X 5/16"1 ML, 1 syringe(s) to inject inuslin bid as directed  Note: 01/25/12   Historical Provider, MD  lisinopril (PRINIVIL,ZESTRIL) 5 MG tablet Take 5 mg by mouth every morning.    Historical Provider, MD  lisinopril (PRINIVIL,ZESTRIL) 5 MG tablet Take by mouth. 05/05/13   Historical Provider, MD  metFORMIN (GLUCOPHAGE) 1000 MG tablet Take 1,000 mg by mouth 2 (two) times daily with a meal.    Historical Provider, MD  Multiple Vitamin (MULTIVITAMIN) capsule Take by mouth.  03/03/12   Historical Provider, MD  nitroGLYCERIN (NITROSTAT) 0.4 MG SL tablet Place under the tongue. 06/16/11   Historical Provider, MD  Omega-3 1000 MG CAPS Take by mouth.    Historical Provider, MD  Oxycodone HCl 10 MG TABS Take 1 tablet (10 mg total) by mouth every 4 (four) hours as needed. 06/05/13   Kathie Rhodes, MD  rosuvastatin (CRESTOR) 5 MG tablet Take 5 mg by mouth every morning.    Historical Provider, MD  tadalafil (CIALIS) 5 MG tablet Take by mouth. 06/06/13   Historical Provider, MD  traZODone (DESYREL) 50 MG tablet Take 50 mg by mouth. 10/02/13   Historical Provider, MD  vitamin B-12 (CYANOCOBALAMIN) 1000 MCG tablet Take 1,000 mcg by mouth every morning.     Historical Provider, MD  vitamin E 400 UNIT capsule Take 800 Units by mouth daily.    Historical Provider, MD   BP 95/55 mmHg  Pulse 87  Temp(Src) 97.6 F (36.4 C) (Oral)  Resp 17  Ht 5\' 7"  (1.702 m)  Wt 205 lb (92.987 kg)  BMI 32.10 kg/m2  SpO2 89% Physical Exam  1225: Physical examination:  Nursing notes reviewed; Vital signs and O2 SAT reviewed;  Constitutional: Well developed, Well nourished, Well hydrated, In no acute distress; Head:  Normocephalic, atraumatic; Eyes: EOMI, PERRL, No scleral icterus; ENMT: Mouth and pharynx normal, Mucous membranes moist; Neck: Supple, Full range of motion, No lymphadenopathy; Cardiovascular: Regular rate and rhythm, No gallop; Respiratory: Breath sounds clear & equal bilaterally, No wheezes.  Speaking full sentences with ease, Normal respiratory effort/excursion; Chest: Nontender, Movement normal; Abdomen: Soft, Nontender, Nondistended, Normal bowel sounds; Genitourinary: No CVA tenderness; Spine:  +TTP left hypertonic trapezius muscle. No rash. No midline CS, TS, LS tenderness.;;  Extremities: Pulses normal, No tenderness, No edema, No calf edema or asymmetry. Left shoulder w/FROM.  NT to palp entire joint, AC joint, clavicle NT, scapula NT, proximal humerus NT, biceps tendon NT over  bicipital groove.  Motor strength at shoulder normal.  Sensation intact over deltoid region, distal NMS intact with left hand having intact  strength in the distribution of the median, radial, and ulnar nerve function compared to opposite side. Pt has decreased sensation left hand in the distribution of the radial nerve.  Strong radial pulse.  +FROM left elbow with intact motor strength biceps and triceps muscles to resistance. NT left elbow/wrist/hand. ; Neuro: AA&Ox3, Major CN grossly intact.  Speech clear. Grips equal. Strength 5/5 equal bilat UE's and LE's. No gross focal motor deficits in extremities.; Skin: Color normal, Warm, Dry.   ED Course  Procedures      EKG Interpretation   Date/Time:  Sunday Jul 29 2014 11:55:06 EDT Ventricular Rate:  97 PR Interval:  194 QRS Duration: 96 QT Interval:  360 QTC Calculation: 457 R Axis:   -72 Text Interpretation:  Normal sinus rhythm Possible Left atrial enlargement  Left axis deviation Anterior infarct , age undetermined Nonspecific T wave  abnormality in lead aVL Artifact When compared with ECG of 06/05/2013  Nonspecific T wave abnormality in lead aVL is now Present Confirmed by  [facility]  MD, Nunzio Cory [phone]) on 07/29/2014 12:45:14 PM      MDM  MDM Reviewed: previous chart, nursing note and vitals Reviewed previous: labs, ECG and MRI Interpretation: labs, ECG, x-ray and CT scan     Results for orders placed or performed during the hospital encounter of 47/82/95  Basic metabolic panel  Result Value Ref Range   Sodium 136 135 - 145 mmol/L   Potassium 4.1 3.5 - 5.1 mmol/L   Chloride 102 101 - 111 mmol/L   CO2 24 22 - 32 mmol/L   Glucose, Bld 220 (H) 65 - 99 mg/dL   BUN 14 6 - 20 mg/dL   Creatinine, Ser 1.01 0.61 - 1.24 mg/dL   Calcium 9.0 8.9 - 10.3 mg/dL   GFR calc non Af Amer >60 >60 mL/min   GFR calc Af Amer >60 >60 mL/min   Anion gap 10 5 - 15  CBC  Result Value Ref Range   WBC 5.9 4.0 - 10.5 K/uL   RBC 5.18 4.22 - 5.81  MIL/uL   Hemoglobin 14.7 13.0 - 17.0 g/dL   HCT 43.8 39.0 - 52.0 %   MCV 84.6 78.0 - 100.0 fL   MCH 28.4 26.0 - 34.0 pg   MCHC 33.6 30.0 - 36.0 g/dL   RDW 13.5 11.5 - 15.5 %   Platelets 181 150 - 400 K/uL  I-stat troponin, ED  (not at [facility])  Result Value Ref Range   Troponin i, poc 0.00 0.00 - 0.08 ng/mL   Comment 3           Dg Chest 2 View 07/29/2014   CLINICAL DATA:  Chest pain and left shoulder pain 1 week metastatic lung cancer.  EXAM: CHEST  2 VIEW  COMPARISON:  11/04/2011  FINDINGS: Sternotomy wires are unchanged. Right IJ Port-A-Cath is unchanged. Lungs are adequately inflated with linear scarring over the anterior midlung on the lateral film. There is no consolidation or effusion. No focal nodule or masses cardiomediastinal silhouette is within normal. There are multiple surgical clips along the left hilar region and mediastinum. There is calcified plaque over the aortic arch. There are degenerative changes of the spine.  IMPRESSION: No acute cardiopulmonary disease.   Electronically Signed   By: Marin Olp M.D.   On: 07/29/2014 14:02   Ct Head Wo Contrast 07/29/2014   CLINICAL DATA:  Left shoulder pain for 1 week  EXAM: CT HEAD WITHOUT CONTRAST  CT CERVICAL SPINE WITHOUT  CONTRAST  TECHNIQUE: Multidetector CT imaging of the head and cervical spine was performed following the standard protocol without intravenous contrast. Multiplanar CT image reconstructions of the cervical spine were also generated.  COMPARISON:  None.  FINDINGS: CT HEAD FINDINGS  There is no mass effect, midline shift, or acute hemorrhage. Vasculature is calcified and somewhat hyperdense bilaterally. Global atrophy. Mild chronic ischemic changes in the periventricular white matter. Mastoid air cells are clear. Cranium is intact  CT CERVICAL SPINE FINDINGS  No acute fracture. No dislocation. No obvious spinal hematoma or soft tissue injury.  There is advanced disc space narrowing at C4-5, C5-6, and C6-7 with  posterior osteophytic ridging and bilateral uncovertebral osteophytes.  IMPRESSION: No acute intracranial pathology. No evidence of cervical spine injury.   Electronically Signed   By: Marybelle Killings M.D.   On: 07/29/2014 13:47   Ct Cervical Spine Wo Contrast 07/29/2014   CLINICAL DATA:  Left shoulder pain for 1 week  EXAM: CT HEAD WITHOUT CONTRAST  CT CERVICAL SPINE WITHOUT CONTRAST  TECHNIQUE: Multidetector CT imaging of the head and cervical spine was performed following the standard protocol without intravenous contrast. Multiplanar CT image reconstructions of the cervical spine were also generated.  COMPARISON:  None.  FINDINGS: CT HEAD FINDINGS  There is no mass effect, midline shift, or acute hemorrhage. Vasculature is calcified and somewhat hyperdense bilaterally. Global atrophy. Mild chronic ischemic changes in the periventricular white matter. Mastoid air cells are clear. Cranium is intact  CT CERVICAL SPINE FINDINGS  No acute fracture. No dislocation. No obvious spinal hematoma or soft tissue injury.  There is advanced disc space narrowing at C4-5, C5-6, and C6-7 with posterior osteophytic ridging and bilateral uncovertebral osteophytes.  IMPRESSION: No acute intracranial pathology. No evidence of cervical spine injury.   Electronically Signed   By: Marybelle Killings M.D.   On: 07/29/2014 13:47   Dg Shoulder Left 07/29/2014   CLINICAL DATA:  Left shoulder and chest pain for 1 week  EXAM: LEFT SHOULDER - 2+ VIEW  COMPARISON:  None.  FINDINGS: No acute fracture. No dislocation. Moderate degenerative changes of the glenohumeral and AC joints.  IMPRESSION: No acute bone pathology.   Electronically Signed   By: Marybelle Killings M.D.   On: 07/29/2014 14:01    1425:  Left neck/shoulder pain appears acute on chronic msk pain vs DDD; tx symptomatically. I am concerned regarding his CP symptoms (unstable angina); he has multiple risk factors and has not been taking his meds for the past several months. Pt already has  taken ASA 325mg  PTA. Topical ntg and IV morphine given for pt's CP symptom with complete improvement. Will admit. T/C to Cards Dr. Ron Parker, case discussed, including:  HPI, pertinent PM/SHx, VS/PE, dx testing, D course and treatment:  Agreeable to admit, requests to start IV heparin, they will come to the ED for evaluation.   Francine Graven, DO 08/01/14 1137

## 2014-07-29 NOTE — ED Notes (Signed)
Pt. Stated, I might have a blot clot in my left shoulder.

## 2014-07-30 ENCOUNTER — Other Ambulatory Visit: Payer: Self-pay | Admitting: Physician Assistant

## 2014-07-30 DIAGNOSIS — I10 Essential (primary) hypertension: Secondary | ICD-10-CM | POA: Diagnosis not present

## 2014-07-30 DIAGNOSIS — M79602 Pain in left arm: Secondary | ICD-10-CM

## 2014-07-30 DIAGNOSIS — F329 Major depressive disorder, single episode, unspecified: Secondary | ICD-10-CM | POA: Diagnosis not present

## 2014-07-30 DIAGNOSIS — M25512 Pain in left shoulder: Secondary | ICD-10-CM | POA: Diagnosis not present

## 2014-07-30 DIAGNOSIS — I25110 Atherosclerotic heart disease of native coronary artery with unstable angina pectoris: Secondary | ICD-10-CM | POA: Diagnosis not present

## 2014-07-30 DIAGNOSIS — R079 Chest pain, unspecified: Secondary | ICD-10-CM | POA: Diagnosis not present

## 2014-07-30 DIAGNOSIS — M5030 Other cervical disc degeneration, unspecified cervical region: Secondary | ICD-10-CM | POA: Diagnosis not present

## 2014-07-30 LAB — BASIC METABOLIC PANEL
Anion gap: 5 (ref 5–15)
BUN: 13 mg/dL (ref 6–20)
CHLORIDE: 106 mmol/L (ref 101–111)
CO2: 28 mmol/L (ref 22–32)
Calcium: 8.6 mg/dL — ABNORMAL LOW (ref 8.9–10.3)
Creatinine, Ser: 0.84 mg/dL (ref 0.61–1.24)
GFR calc Af Amer: >60 mL/min (ref >60)
Glucose, Bld: 77 mg/dL (ref 65–99)
POTASSIUM: 4.1 mmol/L (ref 3.5–5.1)
SODIUM: 139 mmol/L (ref 135–145)

## 2014-07-30 LAB — LIPID PANEL
CHOL/HDL RATIO: 3.6 ratio
Cholesterol: 135 mg/dL (ref 0–200)
HDL: 37 mg/dL — ABNORMAL LOW (ref >40)
LDL Cholesterol: 79 mg/dL (ref 0–99)
Triglycerides: 95 mg/dL (ref <150)
VLDL: 19 mg/dL (ref 0–40)

## 2014-07-30 LAB — CBC
HCT: 42.4 % (ref 39.0–52.0)
Hemoglobin: 14 g/dL (ref 13.0–17.0)
MCH: 28.5 pg (ref 26.0–34.0)
MCHC: 33 g/dL (ref 30.0–36.0)
MCV: 86.2 fL (ref 78.0–100.0)
Platelets: 151 10*3/uL (ref 150–400)
RBC: 4.92 MIL/uL (ref 4.22–5.81)
RDW: 13.7 % (ref 11.5–15.5)
WBC: 4.5 10*3/uL (ref 4.0–10.5)

## 2014-07-30 LAB — GLUCOSE, CAPILLARY: Glucose-Capillary: 86 mg/dL (ref 65–99)

## 2014-07-30 LAB — TROPONIN I: Troponin I: <0.03 ng/mL (ref <0.031)

## 2014-07-30 LAB — HEPARIN LEVEL (UNFRACTIONATED): Heparin Unfractionated: 0.46 IU/mL (ref 0.30–0.70)

## 2014-07-30 MED ORDER — OXYCODONE-ACETAMINOPHEN 5-325 MG PO TABS: 1.0000 | ORAL_TABLET | Freq: Four times a day (QID) | ORAL | Status: AC | PRN

## 2014-07-30 NOTE — Discharge Summary (Addendum)
Physician Discharge Summary     Cardiologist:  Tamala Julian Patient ID: [name] MRN: [id number] DOB/AGE: [birth year] 73 y.o.  Admit date: 07/29/2014 Discharge date: 07/30/2014  Admission Diagnoses:  Unstable angina  Discharge Diagnoses:  Active Problems:   Unstable angina   Chest pain with high risk for cardiac etiology   Left shoulder and arm pain   CAD   Hx CABG  Discharged Condition: stable  Hospital Course:   Mr. Boardley is a 74 y.o.male with known history of CAD, s/p CABG, with history of stable intermittent chest discomfort, hypertension, carotid artery disease (left carotid, seen by Dr. Fletcher Anon on 07/11/2013 no intervention at that time, hyperlipidemia, with complaints of intermittent chest pain and left shoulder and with radiculopathy to the neck and hands. He admits to having this pain on and off for years but this was more intense. He is worried that he is blocking back up his coronary arteries. He has not seen Dr. Tamala Julian in one year and had stopped taking his medications for several months, but restarted a few weeks ago.    He also states he was doing some heavy lifting and twisted his back and left shoulder, while cleaning out a rental property. He is describing two different symptoms. Intermittent chest discomfort that has been worsening over the last few weeks, and musculoskeletal pain which has been constant for 4 days with radiculopathy.       On arrival to ER BP 113/50, HR 98, afebrile CXR negative for CHF, CT of head and neck negative for acute pathology. Troponin negative X1. Glucose 220. EKG NSR with T-wave abnormality in the lateral leads, with left atrial enlargement. He has been treated with Morphine, started on heparin infusion and NTG paste has been placed. He is pain free in his chest, but continues to have pain in left shoulder with diminished ROM.   He was admitted and ruled out for MI. He was on IV heparin and NTG paste.  Labs stable. BP controlled. His  main issue is his shoulder. He continues to have left shoulder pain, which is worse with movement.Ortho follow up recommended.  Nothing acute on left shoulder xray, head CT or CXR.  He ambulated without difficulty.  The patient was seen by Dr. Tamala Julian who felt he was stable for DC home.   Consults: None  Significant Diagnostic Studies:   LEFT SHOULDER - 2+ VIEW  COMPARISON: None.  FINDINGS: No acute fracture. No dislocation. Moderate degenerative changes of the glenohumeral and AC joints.  IMPRESSION: No acute bone pathology.  CHEST 2 VIEW  COMPARISON: 11/04/2011  FINDINGS: Sternotomy wires are unchanged. Right IJ Port-A-Cath is unchanged. Lungs are adequately inflated with linear scarring over the anterior midlung on the lateral film. There is no consolidation or effusion. No focal nodule or masses cardiomediastinal silhouette is within normal. There are multiple surgical clips along the left hilar region and mediastinum. There is calcified plaque over the aortic arch. There are degenerative changes of the spine.  IMPRESSION: No acute cardiopulmonary disease.  CT HEAD WITHOUT CONTRAST  CT CERVICAL SPINE WITHOUT CONTRAST  TECHNIQUE: Multidetector CT imaging of the head and cervical spine was performed following the standard protocol without intravenous contrast. Multiplanar CT image reconstructions of the cervical spine were also generated.  COMPARISON: None.  FINDINGS:  CT HEAD FINDINGS  There is no mass effect, midline shift, or acute hemorrhage. Vasculature is calcified and somewhat hyperdense bilaterally. Global atrophy. Mild chronic ischemic changes in the periventricular white matter. Mastoid air  cells are clear. Cranium is intact  CT CERVICAL SPINE FINDINGS  No acute fracture. No dislocation. No obvious spinal hematoma or soft tissue injury.  There is advanced disc space narrowing at C4-5, C5-6, and C6-7 with posterior osteophytic ridging and  bilateral uncovertebral osteophytes.  IMPRESSION: No acute intracranial pathology. No evidence of cervical spine injury.   Treatments: See above  Discharge Exam: Blood pressure 109/56, pulse 59, temperature 98 F (36.7 C), temperature source Oral, resp. rate 18, height 5\' 7"  (1.702 m), weight 206 lb 4.8 oz (93.577 kg), SpO2 100 %.   Disposition: 01-Home or Self Care      Discharge Instructions    Diet - low sodium heart healthy    Complete by:  As directed      Discharge instructions    Complete by:  As directed   Recommend seeing and orthopaedic MD.            Medication List    STOP taking these medications        calcium citrate 950 MG tablet  Commonly known as:  CALCITRATE - dosed in mg elemental calcium     fish oil-omega-3 fatty acids 1000 MG capsule     glipiZIDE 5 MG tablet  Commonly known as:  GLUCOTROL     lisinopril 5 MG tablet  Commonly known as:  PRINIVIL,ZESTRIL     metFORMIN 1000 MG tablet  Commonly known as:  GLUCOPHAGE     NOVOFINE 30G X 8 MM Misc  Generic drug:  Insulin Pen Needle     Omega-3 1000 MG Caps     vitamin B-12 1000 MCG tablet  Commonly known as:  CYANOCOBALAMIN     Vitamin B12-Folic Acid 983-382 MCG Tabs      TAKE these medications        acetaminophen 650 MG CR tablet  Commonly known as:  TYLENOL  Take 650 mg by mouth every morning. And prn     aspirin 325 MG tablet  Take by mouth.     calcium carbonate 500 MG chewable tablet  Commonly known as:  TUMS - dosed in mg elemental calcium  Chew 1 tablet by mouth daily.     CIALIS 5 MG tablet  Generic drug:  tadalafil  Take by mouth.     glyBURIDE 5 MG tablet  Commonly known as:  DIABETA  Take 5 mg by mouth daily with breakfast.     insulin glargine 100 UNIT/ML injection  Commonly known as:  LANTUS  Inject 50 Units into the skin every morning.     isosorbide mononitrate 30 MG 24 hr tablet  Commonly known as:  IMDUR  Take 30 mg by mouth daily.      multivitamin capsule  Take by mouth.     NITROSTAT 0.4 MG SL tablet  Generic drug:  nitroGLYCERIN  Place under the tongue.     oxyCODONE-acetaminophen 5-325 MG per tablet  Commonly known as:  PERCOCET/ROXICET  Take 1-2 tablets by mouth every 6 (six) hours as needed for moderate pain.     rosuvastatin 5 MG tablet  Commonly known as:  CRESTOR  Take 5 mg by mouth every morning.     traZODone 50 MG tablet  Commonly known as:  DESYREL  Take 50 mg by mouth.       Follow-up Information    Follow up with [facility] On 08/06/2014.   Why:  9:15 AM   Contact information:   [address]  [place] [address] [phone]      Follow up with Sinclair Grooms, MD On 09/03/2014.   Specialty:  Cardiology   Why:  4:15 PM   Contact information:   [address] [place] [address] [phone]      Greater than 30 minutes was spent completing the patient's discharge.    SignedTarri Fuller, [place] 07/30/2014, 12:58 PM  The patient's arm discomfort and shoulder discomfort clearly nonischemic.  His been quite sometime since he has had an ischemic workup. We will schedule this to be done as an outpatient.  I believe he needs to be followed by his primary physician and consider having further workup done to discover the etiology of the shoulder discomfort. Probably a rotator cuff problem.

## 2014-07-30 NOTE — Progress Notes (Signed)
    Subjective: Left shoulder still hurts  Objective: Vital signs in last 24 hours: Temp:  [97.6 F (36.4 C)-98 F (36.7 C)] 98 F (36.7 C) (05/16 0517) Pulse Rate:  [59-98] 59 (05/16 0517) Resp:  [12-21] 18 (05/16 0517) BP: (95-136)/(50-68) 109/56 mmHg (05/16 0517) SpO2:  [89 %-100 %] 100 % (05/16 0517) Weight:  [204 lb 3.2 oz (92.625 kg)-206 lb 4.8 oz (93.577 kg)] 206 lb 4.8 oz (93.577 kg) (05/16 0517) Last BM Date: 07/28/14  Intake/Output from previous day: 05/15 0701 - 05/16 0700 In: 1215 [P.O.:715; I.V.:500] Out: -  Intake/Output this shift:    Medications Scheduled Meds: . aspirin  324 mg Oral NOW   Or  . aspirin  300 mg Rectal NOW  . aspirin EC  81 mg Oral Daily  . metoprolol tartrate  12.5 mg Oral BID  . nitroGLYCERIN  0.5 inch Topical 4 times per day  . rosuvastatin  20 mg Oral q1800   Continuous Infusions: . sodium chloride 100 mL/hr at 07/30/14 0209  . heparin 1,350 Units/hr (07/30/14 0448)   PRN Meds:.acetaminophen, nitroGLYCERIN, ondansetron (ZOFRAN) IV, oxyCODONE-acetaminophen  PE: General appearance: alert, cooperative and no distress Lungs: clear to auscultation bilaterally Heart: regular rate and rhythm, S1, S2 normal, no murmur, click, rub or gallop Extremities: Trace LEE Pulses: 2+ and symmetric Skin: Warm and dry Neurologic: Grossly normal  MS: Pain in left shoulder.  No point tenderness but active pain.   Lab Results:   Recent Labs  07/29/14 1215 07/30/14 0355  WBC 5.9 4.5  HGB 14.7 14.0  HCT 43.8 42.4  PLT 181 151   BMET  Recent Labs  07/29/14 1215 07/30/14 0355  NA 136 139  K 4.1 4.1  CL 102 106  CO2 24 28  GLUCOSE 220* 77  BUN 14 13  CREATININE 1.01 0.84  CALCIUM 9.0 8.6*   Lipid Panel     Component Value Date/Time   CHOL 135 07/30/2014 0355   TRIG 95 07/30/2014 0355   HDL 37* 07/30/2014 0355   CHOLHDL 3.6 07/30/2014 0355   VLDL 19 07/30/2014 0355   LDLCALC 79 07/30/2014 0355      Assessment/Plan 74  y.o.male with known history of CAD, s/p CABG, with history of stable intermittent chest discomfort, hypertension, carotid artery disease (left carotid, seen by Dr. Fletcher Anon on 07/11/2013 no intervention at that time), hyperlipidemia, with complaints of intermittent chest pain and left shoulder and with radiculopathy to the neck and hands.  Active Problems:   Unstable angina   Chest pain with high risk for cardiac etiology   Diabetes   Left shoulder and Neck Pain  Ruled out for MI.  Labs stable.  BP controlled.  His main issue is his shoulder.  To me he reports not having CP but having severe left shoulder pain, which is worse with movement.  He was concerned because his grafts are old.  I think this is musculoskeletal and needs to see an ortho MD in the office.  Consider OP nuclear stress testing.  I DCd the nitro paste and heparin.  Ambulate this morning and likely DC home with OP follow-up.      LOS: 1 day    Lamari Beckles PA-C 07/30/2014 7:41 AM

## 2014-07-30 NOTE — Discharge Instructions (Signed)
Acute Coronary Syndrome  Acute coronary syndrome (ACS) is an urgent problem in which the blood and oxygen supply to the heart is critically deficient. ACS requires hospitalization because one or more coronary arteries may be blocked.  ACS represents a range of conditions including:  · Previous angina that is now unstable, lasts longer, happens at rest, or is more intense.  · A heart attack, with heart muscle cell injury and death.  There are three vital coronary arteries that supply the heart muscle with blood and oxygen so that it can pump blood effectively. If blockages to these arteries develop, blood flow to the heart muscle is reduced. If the heart does not get enough blood, angina may occur as the first warning sign.  SYMPTOMS   · The most common signs of angina include:  ¨ Tightness or squeezing in the chest.  ¨ Feeling of heaviness on the chest.  ¨ Discomfort in the arms, neck, back, or jaw.  ¨ Shortness of breath and nausea.  ¨ Cold, wet skin.  · Angina is usually brought on by physical effort or excitement which increase the oxygen needs of the heart. These states increase the blood flow needs of the heart beyond what can be delivered.  · Other symptoms that are not as common include:  ¨ Fatigue  ¨ Unexplained feelings of nervousness or anxiety  ¨ Weakness  ¨ Diarrhea  · Sometimes, you may not have noticed any symptoms at all but still suffered a cardiac injury.  TREATMENT   · Medicines to help discomfort may include nitroglycerin (nitro) in the form of tablets or a spray for rapid relief, or longer-acting forms such as cream, patches, or capsules. (Be aware that there are many side effects and possible interactions with other drugs).  · Other medicines may be used to help the heart pump better.  · Procedures to open blocked arteries including angioplasty or stent placement to keep the arteries open.  · Open heart surgery may be needed when there are many blockages or they are in critical locations that  are best treated with surgery.  HOME CARE INSTRUCTIONS   · Do not use any tobacco products including cigarettes, chewing tobacco, or electronic cigarettes.  · Take one baby or adult aspirin daily, if your health care provider advises. This helps reduce the risk of a heart attack.  · It is very important that you follow the angina treatment prescribed by your health care provider. Make arrangements for proper follow-up care.  · Eat a heart healthy diet with salt and fat restrictions as advised.  · Regular exercise is good for you as long as it does not cause discomfort. Do not begin any new type of exercise until you check with your health care provider.  · If you are overweight, you should lose weight.  · Try to maintain normal blood lipid levels.  · Keep your blood pressure under control as recommended by your health care provider.  · You should tell your health care provider right away about any increase in the severity or frequency of your chest discomfort or angina attacks. When you have angina, you should stop what you are doing and sit down. This may bring relief in 3 to 5 minutes. If your health care provider has prescribed nitro, take it as directed.  · If your health care provider has given you a follow-up appointment, it is very important to keep that appointment. Not keeping the appointment could result in a chronic or   permanent injury, pain, and disability. If there is any problem keeping the appointment, you must call back to this facility for assistance.  SEEK IMMEDIATE MEDICAL CARE IF:   · You develop nausea, vomiting, or shortness of breath.  · You feel faint, lightheaded, or pass out.  · Your chest discomfort gets worse.  · You are sweating or experience sudden profound fatigue.  · You do not get relief of your chest pain after 3 doses of nitro.  · Your discomfort lasts longer than 15 minutes.  MAKE SURE YOU:   · Understand these instructions.  · Will watch your condition.  · Will get help right  away if you are not doing well or get worse.  · Take all medicines as directed by your health care provider.  Document Released: 03/02/2005 Document Revised: 03/07/2013 Document Reviewed: 07/04/2013  Exit[employer]® Patient Information ©2015 [employer]. This information is not intended to replace advice given to you by your health care provider. Make sure you discuss any questions you have with your health care provider.

## 2014-07-31 LAB — HEMOGLOBIN A1C
HEMOGLOBIN A1C: 8.3 % — AB (ref 4.8–5.6)
MEAN PLASMA GLUCOSE: 192 mg/dL

## 2014-08-02 ENCOUNTER — Telehealth (HOSPITAL_COMMUNITY): Payer: Self-pay | Admitting: *Deleted

## 2014-08-02 NOTE — Telephone Encounter (Signed)
Left message on voicemail in reference to upcoming appointment scheduled for 08/06/14. Phone number given for a call back so details instructions can be given. Crissie Figures, RN

## 2014-08-03 ENCOUNTER — Telehealth (HOSPITAL_COMMUNITY): Payer: Self-pay | Admitting: Radiology

## 2014-08-03 NOTE — Telephone Encounter (Signed)
Patient given detailed instructions per Myocardial Perfusion Study Information Sheet for test on 08/06/2014 at 09:30 am. Patient verbalized understanding. EHK

## 2014-08-06 ENCOUNTER — Ambulatory Visit (HOSPITAL_COMMUNITY): Payer: Medicare Other | Attending: Interventional Cardiology

## 2014-08-06 DIAGNOSIS — I2510 Atherosclerotic heart disease of native coronary artery without angina pectoris: Secondary | ICD-10-CM

## 2014-08-06 DIAGNOSIS — M79602 Pain in left arm: Secondary | ICD-10-CM | POA: Diagnosis not present

## 2014-08-06 LAB — MYOCARDIAL PERFUSION IMAGING
CHL CUP NUCLEAR SDS: 2
CHL CUP STRESS STAGE 1 DBP: 67 mmHg
CHL CUP STRESS STAGE 3 DBP: 58 mmHg
CHL CUP STRESS STAGE 3 HR: 85 {beats}/min
CHL CUP STRESS STAGE 3 SBP: 115 mmHg
CHL CUP STRESS STAGE 4 DBP: 57 mmHg
CHL CUP STRESS STAGE 4 GRADE: 0 %
CHL CUP STRESS STAGE 4 SPEED: 0 mph
CHL CUP STRESS STAGE 5 GRADE: 0 %
CHL CUP STRESS STAGE 5 HR: 86 {beats}/min
CHL CUP STRESS STAGE 5 SPEED: 0 mph
CSEPPBP: 115 mmHg
Estimated workload: 1 METS
LHR: 0.37
LV dias vol: 92 mL
LV sys vol: 47 mL
Nuc Stress EF: 49 %
Peak HR: 85 {beats}/min
Percent of predicted max HR: 57 %
Rest HR: 78 {beats}/min
SRS: 4
SSS: 6
Stage 1 Grade: 0 %
Stage 1 HR: 80 {beats}/min
Stage 1 SBP: 127 mmHg
Stage 1 Speed: 0 mph
Stage 2 Grade: 0.1 %
Stage 2 HR: 80 {beats}/min
Stage 2 Speed: 0 mph
Stage 3 Grade: 10 %
Stage 3 Speed: 0 mph
Stage 4 HR: 85 {beats}/min
Stage 4 SBP: 125 mmHg
Stage 5 DBP: 55 mmHg
Stage 5 SBP: 129 mmHg
TID: 1.01

## 2014-08-06 MED ORDER — TECHNETIUM TC 99M SESTAMIBI GENERIC - CARDIOLITE
11.0000 | Freq: Once | INTRAVENOUS | Status: AC | PRN
  Administered 2014-08-06: 11 via INTRAVENOUS

## 2014-08-06 MED ORDER — TECHNETIUM TC 99M SESTAMIBI GENERIC - CARDIOLITE
33.0000 | Freq: Once | INTRAVENOUS | Status: AC | PRN
  Administered 2014-08-06: 33 via INTRAVENOUS

## 2014-08-06 MED ORDER — REGADENOSON 0.4 MG/5ML IV SOLN
0.4000 mg | Freq: Once | INTRAVENOUS | Status: AC
  Administered 2014-08-06: 0.4 mg via INTRAVENOUS

## 2014-08-21 ENCOUNTER — Telehealth: Payer: Self-pay | Admitting: Interventional Cardiology

## 2014-08-21 NOTE — Telephone Encounter (Signed)
Pt called to have the myocardial perfusion imaging results. Pt had this test done on 08/06/14. Pt is aware that the results are back  but  Dr Tamala Julian needs to review it, Once it is done we will call him back with recommendations. Pt verbalized understanding.

## 2014-08-21 NOTE — Telephone Encounter (Signed)
New message  ° ° °Patient calling for test results.   °

## 2014-08-22 NOTE — Telephone Encounter (Signed)
Called to give pt myoview results, and adv him of Dr.Smith's recommendation. lmtcb

## 2014-08-22 NOTE — Telephone Encounter (Signed)
I'm glad he called. How can we prevent this from happening? Gaspar Bidding ordered, but it was never reviewed by him?   Let the patient know it was only mildly abnormal and does not warrant further investigation unless he is having progressive or recurrent symptoms.If so, he needs to come in to see me soon. Let him know I'm sorry it required his call to get results.  Forward to [name] PAC when done. Gaspar Bidding did this get routed to you or has it been sitting in Epic the entire time with no one assigned? If it has been sitting, please let me know.

## 2014-08-22 NOTE — Telephone Encounter (Signed)
Message routed to Dr.Smith to review pt myoview results and advise.

## 2014-08-24 NOTE — Telephone Encounter (Signed)
Follow up ° ° ° ° ° °Calling to get test results °

## 2014-08-24 NOTE — Telephone Encounter (Signed)
Returned pt call. Pt aware of myoview results only mildly abnormal and does not warrant further investigation unless he is having progressive or recurrent symptoms. Pt sts that he is currently asymptomatic. He does have fatigue from time to time. Pt sts that he does not feel that he needs to be seen on 6/20. Pt was scheduled to be seen by Dr.Smith. Pt appt cancelled. Recall in Epic for 3-4 mo f/u. Adv pt to call the office to be seen sooner if cardiac symptoms develop. Pt agreeable with plan and verbalized understanding.

## 2014-09-03 ENCOUNTER — Ambulatory Visit: Payer: Medicare Other | Admitting: Interventional Cardiology

## 2014-11-07 ENCOUNTER — Other Ambulatory Visit: Payer: Self-pay | Admitting: Nurse Practitioner

## 2014-11-07 DIAGNOSIS — R0989 Other specified symptoms and signs involving the circulatory and respiratory systems: Secondary | ICD-10-CM

## 2015-01-15 ENCOUNTER — Other Ambulatory Visit: Payer: Self-pay | Admitting: Cardiovascular Disease

## 2015-01-15 DIAGNOSIS — I6523 Occlusion and stenosis of bilateral carotid arteries: Secondary | ICD-10-CM

## 2015-01-18 ENCOUNTER — Ambulatory Visit (HOSPITAL_COMMUNITY)
Admission: RE | Admit: 2015-01-18 | Discharge: 2015-01-18 | Disposition: A | Discharge status: Home / Self Care | Payer: Medicare Other | Source: Ambulatory Visit | Attending: Cardiovascular Disease | Admitting: Cardiovascular Disease

## 2015-01-18 DIAGNOSIS — I10 Essential (primary) hypertension: Secondary | ICD-10-CM | POA: Insufficient documentation

## 2015-01-18 DIAGNOSIS — E785 Hyperlipidemia, unspecified: Secondary | ICD-10-CM | POA: Insufficient documentation

## 2015-01-18 DIAGNOSIS — E119 Type 2 diabetes mellitus without complications: Secondary | ICD-10-CM | POA: Insufficient documentation

## 2015-01-18 DIAGNOSIS — I6523 Occlusion and stenosis of bilateral carotid arteries: Secondary | ICD-10-CM | POA: Diagnosis not present

## 2016-02-07 IMAGING — NM NM MISC PROCEDURE
8 series · 48 of 48 positions shown · non-contrast
Comparison: none

[Series 1: wbr_r-card_st rest · 6.4mm · 6.40mm/px · 6 of 19 frames shown]
[frame 2/19]
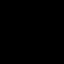
[frame 5/19]
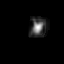
[frame 8/19]
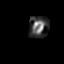
[frame 11/19]
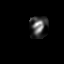
[frame 14/19]
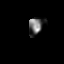
[frame 18/19]
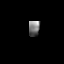

[Series 1: wbr_s-card_st stress-sum-em · 6.4mm · 6.40mm/px · 6 of 21 frames shown]
[frame 2/21]
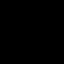
[frame 6/21]
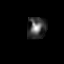
[frame 9/21]
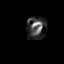
[frame 13/21]
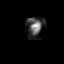
[frame 16/21]
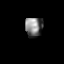
[frame 20/21]
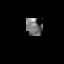

[Series 1: stress-gsp · 6.40mm/px · 6 of 506 frames shown]
[frame 43/506]
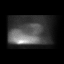
[frame 127/506]
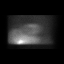
[frame 211/506]
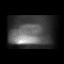
[frame 296/506]
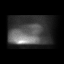
[frame 380/506]
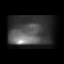
[frame 464/506]
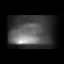

[Series 1: stress-gsp-mc · 6.40mm/px · 6 of 506 frames shown]
[frame 43/506]
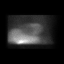
[frame 127/506]
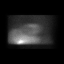
[frame 211/506]
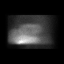
[frame 296/506]
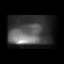
[frame 380/506]
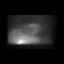
[frame 464/506]
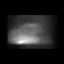

[Series 1: wbr_s-card_st stress-gsp · 6.4mm · 6.40mm/px · 6 of 149 frames shown]
[frame 13/149]
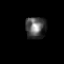
[frame 37/149]
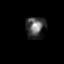
[frame 62/149]
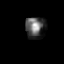
[frame 87/149]
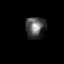
[frame 112/149]
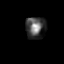
[frame 137/149]
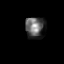

[Series 1: stress-sum-em-mc · 6.40mm/px · 6 of 64 frames shown]
[frame 6/64]
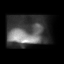
[frame 16/64]
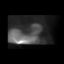
[frame 27/64]
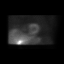
[frame 38/64]
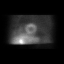
[frame 48/64]
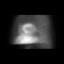
[frame 59/64]
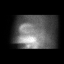

[Series 1: stress-sum-em · 6.40mm/px · 6 of 64 frames shown]
[frame 6/64]
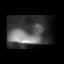
[frame 16/64]
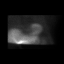
[frame 27/64]
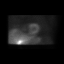
[frame 38/64]
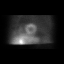
[frame 48/64]
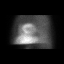
[frame 59/64]
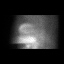

[Series 1: rest · 6.40mm/px · 6 of 64 frames shown]
[frame 6/64]
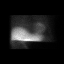
[frame 16/64]
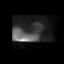
[frame 27/64]
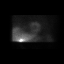
[frame 38/64]
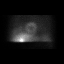
[frame 48/64]
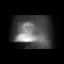
[frame 59/64]
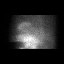

[48 of 48 positions shown; findings below may reference images not displayed]

Canned report from images found in remote index.

Refer to host system for actual result text.

## 2016-02-17 ENCOUNTER — Encounter: Payer: Self-pay | Admitting: Physical Therapy

## 2016-02-17 ENCOUNTER — Ambulatory Visit: Payer: Medicare Other | Attending: Family Medicine | Admitting: Physical Therapy

## 2016-02-17 DIAGNOSIS — R262 Difficulty in walking, not elsewhere classified: Secondary | ICD-10-CM | POA: Insufficient documentation

## 2016-02-17 DIAGNOSIS — G8929 Other chronic pain: Secondary | ICD-10-CM

## 2016-02-17 DIAGNOSIS — M545 Low back pain: Secondary | ICD-10-CM | POA: Diagnosis present

## 2016-02-17 DIAGNOSIS — M62830 Muscle spasm of back: Secondary | ICD-10-CM | POA: Diagnosis present

## 2016-02-17 NOTE — Therapy (Signed)
[facility] [address] [place], [place], [address] Phone: [phone]   Fax:  [phone]  Physical Therapy Evaluation  Patient Details  Name: [name] MRN: [id number] Date of Birth: 1940-11-30 Referring Provider: Eldridge Abrahams  Encounter Date: 02/17/2016      PT End of Session - 02/17/16 1418    Visit Number 1   Date for PT Re-Evaluation 04/19/16   PT Start Time [id number]   PT Stop Time 1435   PT Time Calculation (min) 50 min   Activity Tolerance Patient tolerated treatment well   Behavior During Therapy [facility] for tasks assessed/performed      Past Medical History:  Diagnosis Date  . Arthritis    "back and left hand  . Carpal tunnel syndrome   . Cervical radiculopathy    left  . Chronic lower back pain 1960's - present (11/12/2011)  . Chronic neck and back pain   . Coronary artery disease    CARDIOLOGIST--  DR Mallie Mussel SMITH (LOV  10-05-2011)  . Depression   . GERD (gastroesophageal reflux disease)   . H/O colon cancer, stage IV ONCOLOGIST--  DR Marin Olp---  NO RECURRENCE  PER PT   DX 2006  colorectal with mets to liver, lung----  S/P RIGHT COLECTOMY/  LIVER LOBECTOMY FOR METS/  CHEMOTHERAPY (10 CYCLES 10/2008  &  8 CYCLES 10/2009)   and RADIATION THERAPY  . History of angina    chronic angina  . History of DVT of lower extremity    2000--  LEFT LEG  . History of secondary liver cancer    METS FROM COLON CANCER--  S/P LOBECTOMY( 11/2008) AND RADIOFREQUENCY ABLATION FOR 3 LESIONS  (08/2003)  . Hyperlipidemia   . Hypertension   . Lung metastasis (HCC)    STEREOTACTIC BODY RADIOTHERPY  54Gy IN 3 FRACTIONS RIGHT LUNG  COMPLETED 10-26-2009  (TOTAL DOSES  5400 cGy  in three fractions at 1800 cGy per fraction)  . Rotator cuff disorder    right  . S/P CABG x 5    2000  . Type II diabetes mellitus ([place])     Past Surgical History:  Procedure Laterality Date  . CARDIAC CATHETERIZATION  02-08-2007  DR Daneen Schick    60-70% SVG to [facility]  . CARDIOVASCULAR STRESS TEST  09-30-2011  DR Cumming STUDY BUT UNCHANGED FROM PRIOR STUDY/  SYMPTOMS ARE STABLE  . CARPAL TUNNEL RELEASE Right 05-29-2008  . CATARACT EXTRACTION W/ INTRAOCULAR LENS  IMPLANT, BILATERAL    . CIRCUMCISION N/A 06/05/2013   Procedure: CIRCUMCISION ADULT;  Surgeon: Claybon Jabs, MD;  Location: [facility];  Service: Urology;  Laterality: N/A;  . CORONARY ARTERY BYPASS GRAFT  2000  DR VAN TRIGHT   5 VESSEL/  LIMA  to LAD/  SVG to OM1/  SVG to  DIAGONAL ONE/  SEQUENTIAL SVG to PDA/  PLOM  . PORTACATH PLACEMENT  last one  2010  . POSTERIOR FUSION LUMBAR SPINE  11/12/2011   L4  -- L5  . REPAIR VENTRAL INSICIONAL HERNIA WITH MESH  03-31-2007  . RIGHT HEMICOLECOTMY/  ELECTIVE CHOLECYSTECTOMY/  LIVER BX'S  2006  . RIGHT PARTIAL HEPATECOTMY WEDGE RESECTION  11/2008   [facility]  . SHOULDER ARTHROSCOPY/ ACROMIOPLASTY/  ROTATOR CUFF REPAIR/  OPEN DISTAL CLAVICAL  EXCISION Right 06-26-2010    There were no vitals filed for this visit.       Subjective Assessment - 02/17/16 1353  Subjective Patient reports that he has had LBP for a number of years, he had a posterior fusion in 2013.  He reports that the pain is worse recently.  He is unsure of a cause, reports he has DDD and OA of the spine   Limitations Lifting;Standing;Walking   How long can you stand comfortably? 2 minutes   Patient Stated Goals have less pain   Currently in Pain? Yes   Pain Score 5    Pain Location Back   Pain Orientation Lower;Right   Pain Descriptors / Indicators Aching   Pain Type Chronic pain   Pain Onset More than a month ago   Pain Frequency Constant   Aggravating Factors  standing, lifting, pain up to 10/10   Pain Relieving Factors pain meds at best a 4/10   Effect of Pain on Daily Activities limits all ADL's            [facility] PT Assessment - 02/17/16 0001      Assessment   Medical Diagnosis LBP   Referring Provider  Eldridge Abrahams   Onset Date/Surgical Date 01/18/16   Prior Therapy no     Precautions   Precaution Comments has a lumbar fusion from 2013     Balance Screen   Has the patient fallen in the past 6 months No   Has the patient had a decrease in activity level because of a fear of falling?  No   Is the patient reluctant to leave their home because of a fear of falling?  No     Home Environment   Additional Comments has stairs, does some light housework, does some yardwork     Prior Function   Level of Independence Independent   Vocation Retired   Leisure no exercise     Posture/Postural Control   Posture Comments fwd head, rounded shoulders     ROM / Strength   AROM / PROM / Strength AROM;Strength     AROM   Overall AROM Comments Lumbar ROM decreased 50%      Strength   Overall Strength Comments LE's 4/5     Flexibility   Soft Tissue Assessment /Muscle Length --  tight HS, calves and ITB     Palpation   Palpation comment significant spasms in the Lumbar paraspinals right > left     Ambulation/Gait   Gait Comments no device, slow, small steps, stooped posture                   [facility] Adult PT Treatment/Exercise - 02/17/16 0001      Modalities   Modalities Electrical Stimulation;Moist Heat     Moist Heat Therapy   Number Minutes Moist Heat 15 Minutes   Moist Heat Location Lumbar Spine     Electrical Stimulation   Electrical Stimulation Location lumbar area   Electrical Stimulation Action IFC   Electrical Stimulation Parameters sitting   Electrical Stimulation Goals Pain                PT Education - 02/17/16 1417    Education provided Yes   Education Details wms flexion   Person(s) Educated Patient   Methods Explanation;Demonstration;Handout   Comprehension Verbalized understanding          PT Short Term Goals - 02/17/16 1421      PT SHORT TERM GOAL #1   Title independent with initial HEP   Time 2   Period Weeks   Status New  PT Long Term Goals - 02/17/16 1421      PT LONG TERM GOAL #1   Title decrease pain 50%   Time 8   Period Weeks   Status New     PT LONG TERM GOAL #2   Title increase lumbar ROM 25%   Time 8   Period Weeks   Status New     PT LONG TERM GOAL #3   Title understand proper posture and body mechanics   Time 8   Period Weeks   Status New     PT LONG TERM GOAL #4   Title tolerate standing or walking x 5 minutes without pain > 4/10   Time 8   Period Weeks   Status New               Plan - 02/17/16 1418    Clinical Impression Statement Patient with a long history of LBP, had a 2 level lumbar fusion in 2013.  He has significant spasms int he lumbar paraspinals right > left.  Has decreased ROM.  Difficulty standing and walking.   Rehab Potential Good   PT Frequency 2x / week   PT Duration 8 weeks   PT Treatment/Interventions ADLs/Self Care Home Management;Electrical Stimulation;Moist Heat;Therapeutic activities;Therapeutic exercise;Patient/family education;Manual techniques   PT Next Visit Plan slowly add felxibility, stabilization and gym exercises   Consulted and Agree with Plan of Care Patient      Patient will benefit from skilled therapeutic intervention in order to improve the following deficits and impairments:  Abnormal gait, Decreased activity tolerance, Decreased balance, Decreased mobility, Decreased range of motion, Decreased strength, Difficulty walking, Increased muscle spasms, Impaired flexibility, Postural dysfunction, Improper body mechanics, Pain  Visit Diagnosis: Chronic midline low back pain without sciatica - Plan: PT plan of care cert/re-cert  Muscle spasm of back - Plan: PT plan of care cert/re-cert  Difficulty in walking, not elsewhere classified - Plan: PT plan of care cert/re-cert      G-Codes - [id number]    Functional Assessment Tool Used foto 62% limitation   Functional Limitation Other PT primary   Other PT Primary Current  Status [id number]) At least 60 percent but less than 80 percent impaired, limited or restricted   Other PT Primary Goal Status [id number]) At least 40 percent but less than 60 percent impaired, limited or restricted       Problem List Patient Active Problem List   Diagnosis Date Noted  . Unstable angina ([place]) 07/29/2014  . Chest pain with high risk for cardiac etiology 07/29/2014  . CAD in native artery 07/11/2013  . Essential hypertension 07/11/2013  . Hyperlipidemia 07/11/2013  . Type 2 diabetes mellitus ([place]) 07/11/2013  . Left carotid stenosis 07/11/2013    Sumner Boast., PT 02/17/2016, 2:35 PM  [address] [place], [place], [address] Phone: [phone]   Fax:  [phone]  Name: [name] MRN: [id number] Date of Birth: 04-Mar-1941

## 2016-02-24 ENCOUNTER — Ambulatory Visit: Payer: Medicare Other | Admitting: Physical Therapy

## 2016-02-24 ENCOUNTER — Encounter: Payer: Self-pay | Admitting: Physical Therapy

## 2016-02-24 DIAGNOSIS — M62830 Muscle spasm of back: Secondary | ICD-10-CM

## 2016-02-24 DIAGNOSIS — M545 Low back pain, unspecified: Secondary | ICD-10-CM

## 2016-02-24 DIAGNOSIS — R262 Difficulty in walking, not elsewhere classified: Secondary | ICD-10-CM

## 2016-02-24 DIAGNOSIS — G8929 Other chronic pain: Secondary | ICD-10-CM

## 2016-02-24 NOTE — Therapy (Signed)
[place] [place], [place], [address] Phone: [phone]   Fax:  [phone]  Physical Therapy Treatment  Patient Details  Name: [name] MRN: [id number] Date of Birth: December 31, 1940 Referring Provider: Eldridge Abrahams  Encounter Date: 02/24/2016      PT End of Session - 02/24/16 1046    Visit Number 2   Date for PT Re-Evaluation 04/19/16   PT Start Time 1000   PT Stop Time 1059   PT Time Calculation (min) 59 min   Activity Tolerance Patient tolerated treatment well   Behavior During Therapy [facility] for tasks assessed/performed      Past Medical History:  Diagnosis Date  . Arthritis    "back and left hand  . Carpal tunnel syndrome   . Cervical radiculopathy    left  . Chronic lower back pain 1960's - present (11/12/2011)  . Chronic neck and back pain   . Coronary artery disease    CARDIOLOGIST--  DR Mallie Mussel SMITH (LOV  10-05-2011)  . Depression   . GERD (gastroesophageal reflux disease)   . H/O colon cancer, stage IV ONCOLOGIST--  DR Marin Olp---  NO RECURRENCE  PER PT   DX 2006  colorectal with mets to liver, lung----  S/P RIGHT COLECTOMY/  LIVER LOBECTOMY FOR METS/  CHEMOTHERAPY (10 CYCLES 10/2008  &  8 CYCLES 10/2009)   and RADIATION THERAPY  . History of angina    chronic angina  . History of DVT of lower extremity    2000--  LEFT LEG  . History of secondary liver cancer    METS FROM COLON CANCER--  S/P LOBECTOMY( 11/2008) AND RADIOFREQUENCY ABLATION FOR 3 LESIONS  (08/2003)  . Hyperlipidemia   . Hypertension   . Lung metastasis (HCC)    STEREOTACTIC BODY RADIOTHERPY  54Gy IN 3 FRACTIONS RIGHT LUNG  COMPLETED 10-26-2009  (TOTAL DOSES  5400 cGy  in three fractions at 1800 cGy per fraction)  . Rotator cuff disorder    right  . S/P CABG x 5    2000  . Type II diabetes mellitus ([place])     Past Surgical History:  Procedure Laterality Date  . CARDIAC CATHETERIZATION  02-08-2007  DR Daneen Schick    60-70% SVG to [facility]  . CARDIOVASCULAR STRESS TEST  09-30-2011  DR [place] STUDY BUT UNCHANGED FROM PRIOR STUDY/  SYMPTOMS ARE STABLE  . CARPAL TUNNEL RELEASE Right 05-29-2008  . CATARACT EXTRACTION W/ INTRAOCULAR LENS  IMPLANT, BILATERAL    . CIRCUMCISION N/A 06/05/2013   Procedure: CIRCUMCISION ADULT;  Surgeon: Claybon Jabs, MD;  Location: [facility];  Service: Urology;  Laterality: N/A;  . CORONARY ARTERY BYPASS GRAFT  2000  DR VAN TRIGHT   5 VESSEL/  LIMA  to LAD/  SVG to OM1/  SVG to  DIAGONAL ONE/  SEQUENTIAL SVG to PDA/  PLOM  . PORTACATH PLACEMENT  last one  2010  . POSTERIOR FUSION LUMBAR SPINE  11/12/2011   L4  -- L5  . REPAIR VENTRAL INSICIONAL HERNIA WITH MESH  03-31-2007  . RIGHT HEMICOLECOTMY/  ELECTIVE CHOLECYSTECTOMY/  LIVER BX'S  2006  . RIGHT PARTIAL HEPATECOTMY WEDGE RESECTION  11/2008   [facility]  . SHOULDER ARTHROSCOPY/ ACROMIOPLASTY/  ROTATOR CUFF REPAIR/  OPEN DISTAL CLAVICAL  EXCISION Right 06-26-2010    There were no vitals filed for this visit.      Subjective Assessment - 02/24/16 [id number]  Subjective "The back is hurting, sometimes I cant even walk"   Pain Score 5    Pain Location Back   Pain Orientation Right;Lower                         [facility] Adult PT Treatment/Exercise - 02/24/16 0001      Exercises   Exercises Lumbar     Lumbar Exercises: Stretches   Passive Hamstring Stretch 4 reps;10 seconds     Lumbar Exercises: Aerobic   Stationary Bike NuStep L4 x 6 min      Lumbar Exercises: Machines for Strengthening   Cybex Knee Extension 10lb 2x10   Cybex Knee Flexion 25lb 2x10    Other Lumbar Machine Exercise Rows & Lats 25lb 2x10     Lumbar Exercises: Supine   Bridge 10 reps;2 seconds  x2   Other Supine Lumbar Exercises LE on physo ball, bridges, K2C, reunk rotations      Modalities   Modalities Electrical Stimulation;Moist Heat     Moist Heat Therapy   Number Minutes Moist Heat 15  Minutes   Moist Heat Location Lumbar Spine     Electrical Stimulation   Electrical Stimulation Location lumbar area   Electrical Stimulation Action IFC   Electrical Stimulation Parameters sitting   Electrical Stimulation Goals Pain                  PT Short Term Goals - 02/17/16 1421      PT SHORT TERM GOAL #1   Title independent with initial HEP   Time 2   Period Weeks   Status New           PT Long Term Goals - 02/17/16 1421      PT LONG TERM GOAL #1   Title decrease pain 50%   Time 8   Period Weeks   Status New     PT LONG TERM GOAL #2   Title increase lumbar ROM 25%   Time 8   Period Weeks   Status New     PT LONG TERM GOAL #3   Title understand proper posture and body mechanics   Time 8   Period Weeks   Status New     PT LONG TERM GOAL #4   Title tolerate standing or walking x 5 minutes without pain > 4/10   Time 8   Period Weeks   Status New               Plan - 02/24/16 1047    Clinical Impression Statement Pt with an initial progression to exercises. Pt does required cues to stop and rest between sets. with a rigid froward flexed posture when walking. Pt reports a constant 5/10 pain.    Rehab Potential Good   PT Frequency 2x / week   PT Duration 8 weeks   PT Treatment/Interventions ADLs/Self Care Home Management;Electrical Stimulation;Moist Heat;Therapeutic activities;Therapeutic exercise;Patient/family education;Manual techniques   PT Next Visit Plan slowly add felxibility, stabilization and gym exercises      Patient will benefit from skilled therapeutic intervention in order to improve the following deficits and impairments:  Abnormal gait, Decreased activity tolerance, Decreased balance, Decreased mobility, Decreased range of motion, Decreased strength, Difficulty walking, Increased muscle spasms, Impaired flexibility, Postural dysfunction, Improper body mechanics, Pain  Visit Diagnosis: Muscle spasm of back  Difficulty  in walking, not elsewhere classified  Chronic midline low back pain without sciatica     Problem List  Patient Active Problem List   Diagnosis Date Noted  . Unstable angina ([place]) 07/29/2014  . Chest pain with high risk for cardiac etiology 07/29/2014  . CAD in native artery 07/11/2013  . Essential hypertension 07/11/2013  . Hyperlipidemia 07/11/2013  . Type 2 diabetes mellitus ([place]) 07/11/2013  . Left carotid stenosis 07/11/2013    Scot Jun, PTA 02/24/2016, 10:49 AM  [place] [address] [place], [place], [address] Phone: [phone]   Fax:  [phone]  Name: [name] MRN: [id number] Date of Birth: 05-01-1940

## 2016-03-02 ENCOUNTER — Ambulatory Visit: Payer: Medicare Other | Admitting: Physical Therapy

## 2016-03-02 ENCOUNTER — Encounter: Payer: Self-pay | Admitting: Physical Therapy

## 2016-03-02 DIAGNOSIS — M62830 Muscle spasm of back: Secondary | ICD-10-CM

## 2016-03-02 DIAGNOSIS — G8929 Other chronic pain: Secondary | ICD-10-CM

## 2016-03-02 DIAGNOSIS — M545 Low back pain: Secondary | ICD-10-CM

## 2016-03-02 DIAGNOSIS — R262 Difficulty in walking, not elsewhere classified: Secondary | ICD-10-CM

## 2016-03-02 NOTE — Therapy (Signed)
[place] [address] [place], [place], [address] Phone: [phone]   Fax:  [phone]  Physical Therapy Treatment  Patient Details  Name: [name] MRN: [id number] Date of Birth: [birth year] Referring Provider: Eldridge Abrahams  Encounter Date: 03/02/2016      PT End of Session - 03/02/16 1057    Visit Number 3   Date for PT Re-Evaluation 04/19/16   PT Start Time [id number]   PT Stop Time 1112   PT Time Calculation (min) 57 min   Activity Tolerance Patient tolerated treatment well   Behavior During Therapy [facility] for tasks assessed/performed      Past Medical History:  Diagnosis Date  . Arthritis    "back and left hand  . Carpal tunnel syndrome   . Cervical radiculopathy    left  . Chronic lower back pain 1960's - present (11/12/2011)  . Chronic neck and back pain   . Coronary artery disease    CARDIOLOGIST--  DR Mallie Mussel SMITH (LOV  10-05-2011)  . Depression   . GERD (gastroesophageal reflux disease)   . H/O colon cancer, stage IV ONCOLOGIST--  DR Marin Olp---  NO RECURRENCE  PER PT   DX 2006  colorectal with mets to liver, lung----  S/P RIGHT COLECTOMY/  LIVER LOBECTOMY FOR METS/  CHEMOTHERAPY (10 CYCLES 10/2008  &  8 CYCLES 10/2009)   and RADIATION THERAPY  . History of angina    chronic angina  . History of DVT of lower extremity    2000--  LEFT LEG  . History of secondary liver cancer    METS FROM COLON CANCER--  S/P LOBECTOMY( 11/2008) AND RADIOFREQUENCY ABLATION FOR 3 LESIONS  (08/2003)  . Hyperlipidemia   . Hypertension   . Lung metastasis (HCC)    STEREOTACTIC BODY RADIOTHERPY  54Gy IN 3 FRACTIONS RIGHT LUNG  COMPLETED 10-26-2009  (TOTAL DOSES  5400 cGy  in three fractions at 1800 cGy per fraction)  . Rotator cuff disorder    right  . S/P CABG x 5    2000  . Type II diabetes mellitus ([place])     Past Surgical History:  Procedure Laterality Date  . CARDIAC CATHETERIZATION  02-08-2007  DR Daneen Schick    60-70% SVG to [facility]  . CARDIOVASCULAR STRESS TEST  09-30-2011  DR [place] STUDY BUT UNCHANGED FROM PRIOR STUDY/  SYMPTOMS ARE STABLE  . CARPAL TUNNEL RELEASE Right 05-29-2008  . CATARACT EXTRACTION W/ INTRAOCULAR LENS  IMPLANT, BILATERAL    . CIRCUMCISION N/A 06/05/2013   Procedure: CIRCUMCISION ADULT;  Surgeon: Claybon Jabs, MD;  Location[facility];  Service: Urology;  Laterality: N/A;  . CORONARY ARTERY BYPASS GRAFT  2000  DR VAN TRIGHT   5 VESSEL/  LIMA  to LAD/  SVG to OM1/  SVG to  DIAGONAL ONE/  SEQUENTIAL SVG to PDA/  PLOM  . PORTACATH PLACEMENT  last one  2010  . POSTERIOR FUSION LUMBAR SPINE  11/12/2011   L4  -- L5  . REPAIR VENTRAL INSICIONAL HERNIA WITH MESH  03-31-2007  . RIGHT HEMICOLECOTMY/  ELECTIVE CHOLECYSTECTOMY/  LIVER BX'S  2006  . RIGHT PARTIAL HEPATECOTMY WEDGE RESECTION  11/2008   [facility]  . SHOULDER ARTHROSCOPY/ ACROMIOPLASTY/  ROTATOR CUFF REPAIR/  OPEN DISTAL CLAVICAL  EXCISION Right 06-26-2010    There were no vitals filed for this visit.      Subjective Assessment - 03/02/16 1017  Subjective "Been in a lot of pain, getting old things are wearing out"   Currently in Pain? Yes   Pain Score 8   walking in, 4/10 on NuStep    Pain Location Back                         [facility] Adult PT Treatment/Exercise - 03/02/16 0001      Lumbar Exercises: Stretches   Passive Hamstring Stretch 4 reps;10 seconds   Lower Trunk Rotation 2 reps;10 seconds     Lumbar Exercises: Aerobic   Stationary Bike NuStep L4 x 8 min      Lumbar Exercises: Machines for Strengthening   Cybex Knee Extension 10lb 3x10   Cybex Knee Flexion 25lb 3x10    Other Lumbar Machine Exercise Rows & Lats 25lb 2x10     Lumbar Exercises: Standing   Other Standing Lumbar Exercises streight arm pull downs 25lb 2x15     Lumbar Exercises: Supine   Other Supine Lumbar Exercises LE on physo ball, bridges, K2C, reunk rotations       Modalities   Modalities Electrical Stimulation;Moist Heat     Moist Heat Therapy   Number Minutes Moist Heat 15 Minutes   Moist Heat Location Lumbar Spine     Electrical Stimulation   Electrical Stimulation Location lumbar area   Electrical Stimulation Action IFC   Electrical Stimulation Goals Pain                  PT Short Term Goals - 02/17/16 1421      PT SHORT TERM GOAL #1   Title independent with initial HEP   Time 2   Period Weeks   Status New           PT Long Term Goals - 03/02/16 1043      PT LONG TERM GOAL #1   Title decrease pain 50%   Status On-going               Plan - 03/02/16 1058    Clinical Impression Statement Pt ambulated with a forward flexed rigid posture. He is able to complete all of today's interventions. He does have some difficulty transferring from supine to short sitting requiring assist at times.    Rehab Potential Good   PT Duration 8 weeks   PT Treatment/Interventions ADLs/Self Care Home Management;Electrical Stimulation;Moist Heat;Therapeutic activities;Therapeutic exercise;Patient/family education;Manual techniques   PT Next Visit Plan slowly add flexibility, stabilization and gym exercises      Patient will benefit from skilled therapeutic intervention in order to improve the following deficits and impairments:  Abnormal gait, Decreased activity tolerance, Decreased balance, Decreased mobility, Decreased range of motion, Decreased strength, Difficulty walking, Increased muscle spasms, Impaired flexibility, Postural dysfunction, Improper body mechanics, Pain  Visit Diagnosis: Muscle spasm of back  Difficulty in walking, not elsewhere classified  Chronic midline low back pain without sciatica     Problem List Patient Active Problem List   Diagnosis Date Noted  . Unstable angina ([place]) 07/29/2014  . Chest pain with high risk for cardiac etiology 07/29/2014  . CAD in native artery 07/11/2013  . Essential  hypertension 07/11/2013  . Hyperlipidemia 07/11/2013  . Type 2 diabetes mellitus ([place]) 07/11/2013  . Left carotid stenosis 07/11/2013    Scot Jun, PTA 03/02/2016, 11:00 AM  [place], [place], [address] Phone: [phone]   Fax:  [phone]  Name: [name] MRN: [id number] Date of Birth: [birth year]

## 2016-03-11 ENCOUNTER — Ambulatory Visit: Payer: Medicare Other | Admitting: Physical Therapy

## 2016-03-11 ENCOUNTER — Encounter: Payer: Self-pay | Admitting: Physical Therapy

## 2016-03-11 DIAGNOSIS — M545 Low back pain, unspecified: Secondary | ICD-10-CM

## 2016-03-11 DIAGNOSIS — M62830 Muscle spasm of back: Secondary | ICD-10-CM

## 2016-03-11 DIAGNOSIS — G8929 Other chronic pain: Secondary | ICD-10-CM

## 2016-03-11 DIAGNOSIS — R262 Difficulty in walking, not elsewhere classified: Secondary | ICD-10-CM

## 2016-03-11 NOTE — Therapy (Addendum)
[address] [place], [place], [address] Phone: [phone]   Fax:  [phone]  Physical Therapy Treatment  Patient Details  Name: [name] MRN: [id number] Date of Birth: Jan 24, 1941 Referring Provider: Eldridge Abrahams  Encounter Date: 03/11/2016      PT End of Session - 03/11/16 1118    Visit Number 4   Date for PT Re-Evaluation 04/19/16   PT Start Time 4235   PT Stop Time 1131   PT Time Calculation (min) 56 min   Behavior During Therapy [facility] for tasks assessed/performed      Past Medical History:  Diagnosis Date  . Arthritis    "back and left hand  . Carpal tunnel syndrome   . Cervical radiculopathy    left  . Chronic lower back pain 1960's - present (11/12/2011)  . Chronic neck and back pain   . Coronary artery disease    CARDIOLOGIST--  DR Mallie Mussel SMITH (LOV  10-05-2011)  . Depression   . GERD (gastroesophageal reflux disease)   . H/O colon cancer, stage IV ONCOLOGIST--  DR Marin Olp---  NO RECURRENCE  PER PT   DX 2006  colorectal with mets to liver, lung----  S/P RIGHT COLECTOMY/  LIVER LOBECTOMY FOR METS/  CHEMOTHERAPY (10 CYCLES 10/2008  &  8 CYCLES 10/2009)   and RADIATION THERAPY  . History of angina    chronic angina  . History of DVT of lower extremity    2000--  LEFT LEG  . History of secondary liver cancer    METS FROM COLON CANCER--  S/P LOBECTOMY( 11/2008) AND RADIOFREQUENCY ABLATION FOR 3 LESIONS  (08/2003)  . Hyperlipidemia   . Hypertension   . Lung metastasis (HCC)    STEREOTACTIC BODY RADIOTHERPY  54Gy IN 3 FRACTIONS RIGHT LUNG  COMPLETED 10-26-2009  (TOTAL DOSES  5400 cGy  in three fractions at 1800 cGy per fraction)  . Rotator cuff disorder    right  . S/P CABG x 5    2000  . Type II diabetes mellitus ([place])     Past Surgical History:  Procedure Laterality Date  . CARDIAC CATHETERIZATION  02-08-2007  DR Daneen Schick   60-70% SVG to [facility]  . CARDIOVASCULAR STRESS TEST   09-30-2011  DR [place] STUDY BUT UNCHANGED FROM PRIOR STUDY/  SYMPTOMS ARE STABLE  . CARPAL TUNNEL RELEASE Right 05-29-2008  . CATARACT EXTRACTION W/ INTRAOCULAR LENS  IMPLANT, BILATERAL    . CIRCUMCISION N/A 06/05/2013   Procedure: CIRCUMCISION ADULT;  Surgeon: Claybon Jabs, MD;  Location: [facility];  Service: Urology;  Laterality: N/A;  . CORONARY ARTERY BYPASS GRAFT  2000  DR VAN TRIGHT   5 VESSEL/  LIMA  to LAD/  SVG to OM1/  SVG to  DIAGONAL ONE/  SEQUENTIAL SVG to PDA/  PLOM  . PORTACATH PLACEMENT  last one  2010  . POSTERIOR FUSION LUMBAR SPINE  11/12/2011   L4  -- L5  . REPAIR VENTRAL INSICIONAL HERNIA WITH MESH  03-31-2007  . RIGHT HEMICOLECOTMY/  ELECTIVE CHOLECYSTECTOMY/  LIVER BX'S  2006  . RIGHT PARTIAL HEPATECOTMY WEDGE RESECTION  11/2008   [facility]  . SHOULDER ARTHROSCOPY/ ACROMIOPLASTY/  ROTATOR CUFF REPAIR/  OPEN DISTAL CLAVICAL  EXCISION Right 06-26-2010    There were no vitals filed for this visit.      Subjective Assessment - 03/11/16 1039    Subjective "Feeling all right today"  Currently in Pain? Yes   Pain Score 4    Pain Location Back   Pain Orientation Lower            [facility] PT Assessment - 03/11/16 0001      AROM   Overall AROM Comments Lumbar ROM WFL                     [facility] Adult PT Treatment/Exercise - 03/11/16 0001      Lumbar Exercises: Aerobic   Stationary Bike NuStep L5 x 8 min      Lumbar Exercises: Machines for Strengthening   Cybex Knee Extension 10lb 3x10   Cybex Knee Flexion 25lb 3x10    Leg Press 20lb 2x10   Other Lumbar Machine Exercise Rows & Lats 35lb 2x10     Lumbar Exercises: Standing   Other Standing Lumbar Exercises overhead back ext with yellow ball 2x10     Lumbar Exercises: Seated   Sit to Stand 10 reps  x3 with weighted ball      Modalities   Modalities Electrical Stimulation;Moist Heat     Moist Heat Therapy   Number Minutes Moist Heat 15 Minutes    Moist Heat Location Lumbar Spine     Electrical Stimulation   Electrical Stimulation Location lumbar area   Electrical Stimulation Action IFC   Electrical Stimulation Goals Pain                  PT Short Term Goals - 02/17/16 1421      PT SHORT TERM GOAL #1   Title independent with initial HEP   Time 2   Period Weeks   Status New           PT Long Term Goals - 03/11/16 1050      PT LONG TERM GOAL #1   Title decrease pain 50%   Status Partially Met     PT LONG TERM GOAL #3   Title understand proper posture and body mechanics   Status Achieved               Plan - 03/11/16 1118    Clinical Impression Statement pt has progressed completing some of his LTG's. Pt increased his lumbar ROM without ant increase in pain. Pt performed all exercises well. Does reports some back discomfort with overhead extensions.   Rehab Potential Good   PT Frequency 2x / week   PT Duration 8 weeks   PT Treatment/Interventions ADLs/Self Care Home Management;Electrical Stimulation;Moist Heat;Therapeutic activities;Therapeutic exercise;Patient/family education;Manual techniques   PT Next Visit Plan slowly add flexibility, stabilization and gym exercises      Patient will benefit from skilled therapeutic intervention in order to improve the following deficits and impairments:  Abnormal gait, Decreased activity tolerance, Decreased balance, Decreased mobility, Decreased range of motion, Decreased strength, Difficulty walking, Increased muscle spasms, Impaired flexibility, Postural dysfunction, Improper body mechanics, Pain  Visit Diagnosis: Difficulty in walking, not elsewhere classified  Muscle spasm of back  Chronic midline low back pain without sciatica     Problem List Patient Active Problem List   Diagnosis Date Noted  . Unstable angina (H[facility]) 07/29/2014  . Chest pain with high risk for cardiac etiology 07/29/2014  . CAD in native artery 07/11/2013  . Essential  hypertension 07/11/2013  . Hyperlipidemia 07/11/2013  . Type 2 diabetes mellitus (HCC) 07/11/2013  . Left carotid stenosis 07/11/2013   PHYSICAL THERAPY DISCHARGE SUMMARY  Visits from Start of Care: 4 Plan: Patient agrees  to discharge.  Patient goals were partially met. Patient is being discharged due to being pleased with the current functional level.  ?????        Scot Jun, PTA 03/11/2016, 11:20 AM  [address] [place], [place], [address] Phone: [phone]   Fax:  [phone]  Name: [name] MRN: [id number] Date of Birth: 05/29/1940

## 2016-03-18 ENCOUNTER — Ambulatory Visit: Payer: Medicare Other | Admitting: Physical Therapy

## 2016-03-23 ENCOUNTER — Telehealth: Payer: Self-pay | Admitting: Interventional Cardiology

## 2016-03-23 NOTE — Telephone Encounter (Signed)
Left message to call back  

## 2016-03-23 NOTE — Telephone Encounter (Signed)
Spoke with [name] and she said she was sent to me because they have sent a referral for pt x 2 and received no response. She said they faxed them.  Advised her that I am not sure why they haven't received a reply but I do not get the referrals sent for pt's.  Advised her pt is already scheduled to see Dr. Tamala Julian and a decision on carotid dopplers will be made at that time.  [name] appreciative for assistance.

## 2016-03-23 NOTE — Telephone Encounter (Signed)
New Message      in regards to 2 referrals one for appt with Dr Tamala Julian and a Carotid , [facility] needs to have follow up appointments done , they said they faxed referrals on 12/29 & 1/3

## 2016-05-26 ENCOUNTER — Encounter: Payer: Self-pay | Admitting: Interventional Cardiology

## 2016-05-31 NOTE — Progress Notes (Signed)
Cardiology Office Note    Date:  06/01/2016   ID:  Jimmy, Velazquez [birth year], MRN [id number]  PCP:  Berkley Harvey, NP  Cardiologist: Sinclair Grooms, MD   Chief Complaint  Patient presents with  . Coronary Artery Disease    History of Present Illness:  [name] is a 76 y.o. male with CAD and prior CABG, chronic diastolic heart failure, carotid disease, PAD, hypertension, DM II, colorectal cancer and hyperlipidemia.  Awakens with left arm discomfort most mornings.Has intermittent sharp chest discomfort brought on by physical activity. It resolves with rest. He does not use nitroglycerin. No associated dyspnea. No peripheral edema.  Past Medical History:  Diagnosis Date  . Arthritis    "back and left hand  . Carpal tunnel syndrome   . Cervical radiculopathy    left  . Chronic lower back pain 1960's - present (11/12/2011)  . Chronic neck and back pain   . Coronary artery disease    CARDIOLOGIST--  DR Mallie Mussel SMITH (LOV  10-05-2011)  . Depression   . GERD (gastroesophageal reflux disease)   . H/O colon cancer, stage IV ONCOLOGIST--  DR Marin Olp---  NO RECURRENCE  PER PT   DX 2006  colorectal with mets to liver, lung----  S/P RIGHT COLECTOMY/  LIVER LOBECTOMY FOR METS/  CHEMOTHERAPY (10 CYCLES 10/2008  &  8 CYCLES 10/2009)   and RADIATION THERAPY  . History of angina    chronic angina  . History of DVT of lower extremity    2000--  LEFT LEG  . History of secondary liver cancer    METS FROM COLON CANCER--  S/P LOBECTOMY( 11/2008) AND RADIOFREQUENCY ABLATION FOR 3 LESIONS  (08/2003)  . Hyperlipidemia   . Hypertension   . Lung metastasis (HCC)    STEREOTACTIC BODY RADIOTHERPY  54Gy IN 3 FRACTIONS RIGHT LUNG  COMPLETED 10-26-2009  (TOTAL DOSES  5400 cGy  in three fractions at 1800 cGy per fraction)  . Rotator cuff disorder    right  . S/P CABG x 5    2000  . Type II diabetes mellitus ([place])     Past Surgical History:  Procedure Laterality Date  . CARDIAC  CATHETERIZATION  02-08-2007  DR Daneen Schick   60-70% SVG to [facility]  . CARDIOVASCULAR STRESS TEST  09-30-2011  DR Willard STUDY BUT UNCHANGED FROM PRIOR STUDY/  SYMPTOMS ARE STABLE  . CARPAL TUNNEL RELEASE Right 05-29-2008  . CATARACT EXTRACTION W/ INTRAOCULAR LENS  IMPLANT, BILATERAL    . CIRCUMCISION N/A 06/05/2013   Procedure: CIRCUMCISION ADULT;  Surgeon: Claybon Jabs, MD;  Location: [facility];  Service: Urology;  Laterality: N/A;  . CORONARY ARTERY BYPASS GRAFT  2000  DR VAN TRIGHT   5 VESSEL/  LIMA  to LAD/  SVG to OM1/  SVG to  DIAGONAL ONE/  SEQUENTIAL SVG to PDA/  PLOM  . PORTACATH PLACEMENT  last one  2010  . POSTERIOR FUSION LUMBAR SPINE  11/12/2011   L4  -- L5  . REPAIR VENTRAL INSICIONAL HERNIA WITH MESH  03-31-2007  . RIGHT HEMICOLECOTMY/  ELECTIVE CHOLECYSTECTOMY/  LIVER BX'S  2006  . RIGHT PARTIAL HEPATECOTMY WEDGE RESECTION  11/2008   [facility]  . SHOULDER ARTHROSCOPY/ ACROMIOPLASTY/  ROTATOR CUFF REPAIR/  OPEN DISTAL CLAVICAL  EXCISION Right 06-26-2010    Current Medications: Outpatient Medications Prior to Visit  Medication Sig Dispense Refill  . acetaminophen (TYLENOL) 650 MG CR tablet  Take 650 mg by mouth every morning. And prn    . calcium carbonate (TUMS - DOSED IN MG ELEMENTAL CALCIUM) 500 MG chewable tablet Chew 1 tablet by mouth daily.    Marland Kitchen glyBURIDE (DIABETA) 5 MG tablet Take 5 mg by mouth daily with breakfast.    . insulin glargine (LANTUS) 100 UNIT/ML injection Inject 50 Units into the skin every morning.     . isosorbide mononitrate (IMDUR) 30 MG 24 hr tablet Take 30 mg by mouth daily.    . Multiple Vitamin (MULTIVITAMIN) capsule Take by mouth.    . nitroGLYCERIN (NITROSTAT) 0.4 MG SL tablet Place under the tongue.    Marland Kitchen oxyCODONE-acetaminophen (PERCOCET/ROXICET) 5-325 MG per tablet Take 1-2 tablets by mouth every 6 (six) hours as needed for moderate pain. 30 tablet 0  . rosuvastatin (CRESTOR) 5 MG tablet Take 5 mg by mouth  every morning.    . tadalafil (CIALIS) 5 MG tablet Take by mouth.    . traZODone (DESYREL) 50 MG tablet Take 50 mg by mouth.    Marland Kitchen aspirin 325 MG tablet Take by mouth.     No facility-administered medications prior to visit.      Allergies:   Amoxicillin and Lipitor [atorvastatin]   Social History   Social History  . Marital status: Divorced    Spouse name: N/A  . Number of children: N/A  . Years of education: N/A   Social History Main Topics  . Smoking status: Never Smoker  . Smokeless tobacco: Never Used  . Alcohol use No  . Drug use: No  . Sexual activity: Not Asked   Other Topics Concern  . None   Social History Narrative  . None     Family History:  The patient's family history includes Other in his father and mother.   ROS:   Please see the history of present illness.    Decreased sensation in left arm and weakness upon awakening each morning. Tremor is new with primidone started by neurology. Appetite is stable. No blood in the urine or stool. No episodes of syncope. Denies racing heart. Frequent urination . Depression, back pain, vision disturbance, constipation, snoring, and difficulty with balance. All other systems reviewed and are negative.   PHYSICAL EXAM:   VS:  BP (!) 104/58 (BP Location: Left Arm)   Pulse 86   Ht 5\' 7"  (1.702 m)   Wt 203 lb (92.1 kg)   BMI 31.79 kg/m    GEN: Well nourished, well developed, in no acute distress  HEENT: normal  Neck: no JVD. There is a faint right carotid bruit. No masses Cardiac: RRR; no murmurs, rubs, or gallops,no edema  Respiratory:  clear to auscultation bilaterally, normal work of breathing GI: soft, nontender, nondistended, + BS MS: no deformity or atrophy  Skin: warm and dry, no rash Neuro:  Alert and Oriented x 3, Strength and sensation are intact Psych: euthymic mood, full affect  Wt Readings from Last 3 Encounters:  06/01/16 203 lb (92.1 kg)  08/06/14 201 lb (91.2 kg)  07/30/14 206 lb 4.8 oz (93.6  kg)      Studies/Labs Reviewed:   EKG:  EKG  Normal sinus rhythm, left atrial abnormality, left axis deviation. When compared to prior tracings from 2016, the rate is slower.  Recent Labs: No results found for requested labs within last 8760 hours.   Lipid Panel    Component Value Date/Time   CHOL 135 07/30/2014 0355   TRIG 95 07/30/2014 0355  HDL 37 (L) 07/30/2014 0355   CHOLHDL 3.6 07/30/2014 0355   VLDL 19 07/30/2014 0355   LDLCALC 79 07/30/2014 0355    Additional studies/ records that were reviewed today include:  No recent functional data. Last office visit 2016. States that his primary did a carotid Doppler within the past 6 months and recommended yearly follow-up.    ASSESSMENT:    1. Coronary artery disease involving coronary bypass graft of native heart with angina pectoris ([place])   2. Essential hypertension   3. Left carotid stenosis   4. Type 2 diabetes mellitus with complication, without long-term current use of insulin (HCC)   5. Other hyperlipidemia   6. PAD (peripheral artery disease) (HCC)      PLAN:  In order of problems listed above:  1. Decrease aspirin to 81 mg per day. Use nitroglycerin if discomfort persists. Notify us if he has progression of "sharp" chest discomfort precipitated by activity. 2. Blood pressure is relatively low in both arms. I do not believe that numbness and tingling in left arm is vascular related. 3. Continue to follow yearly carotid Dopplers as he has less than 60% obstruction in the left carotid. Though there is a right carotid bruit no significant obstruction/plaque is noted. 4. This is his main problem and needs excellent control with A1c levels less than 7.0. This was discussed. 5. LDL target should be 70 or less. 6. Diminished pulses in both lower extremities with no pedal pulses on either side and absent right popliteal    Medication Adjustments/Labs and Tests Ordered: Current medicines are reviewed at length with  the patient today.  Concerns regarding medicines are outlined above.  Medication changes, Labs and Tests ordered today are listed in the Patient Instructions below. Patient Instructions  Medication Instructions:  1) DECREASE Aspirin 81mg  once daily.  Labwork: None  Testing/Procedures: None  Follow-Up: Your physician wants you to follow-up in: 1 year with Dr. Tamala Julian.  You will receive a reminder letter in the mail two months in advance. If you don't receive a letter, please call our office to schedule the follow-up appointment.   Any Other Special Instructions Will Be Listed Below (If Applicable).     If you need a refill on your cardiac medications before your next appointment, please call your pharmacy.      Signed, Sinclair Grooms, MD  06/01/2016 9:18 AM    [facility] [address], [place], [place]  [address] Phone: [phone]; Fax: [phone]

## 2016-06-01 ENCOUNTER — Encounter: Payer: Self-pay | Admitting: Interventional Cardiology

## 2016-06-01 ENCOUNTER — Ambulatory Visit (INDEPENDENT_AMBULATORY_CARE_PROVIDER_SITE_OTHER): Payer: Medicare Other | Admitting: Interventional Cardiology

## 2016-06-01 ENCOUNTER — Encounter (INDEPENDENT_AMBULATORY_CARE_PROVIDER_SITE_OTHER): Payer: Self-pay

## 2016-06-01 VITALS — BP 104/58 | HR 86 | Ht 67.0 in | Wt 203.0 lb

## 2016-06-01 DIAGNOSIS — E118 Type 2 diabetes mellitus with unspecified complications: Secondary | ICD-10-CM | POA: Diagnosis not present

## 2016-06-01 DIAGNOSIS — I6522 Occlusion and stenosis of left carotid artery: Secondary | ICD-10-CM | POA: Diagnosis not present

## 2016-06-01 DIAGNOSIS — I739 Peripheral vascular disease, unspecified: Secondary | ICD-10-CM | POA: Diagnosis not present

## 2016-06-01 DIAGNOSIS — I10 Essential (primary) hypertension: Secondary | ICD-10-CM | POA: Diagnosis not present

## 2016-06-01 DIAGNOSIS — I25709 Atherosclerosis of coronary artery bypass graft(s), unspecified, with unspecified angina pectoris: Secondary | ICD-10-CM | POA: Diagnosis not present

## 2016-06-01 DIAGNOSIS — E784 Other hyperlipidemia: Secondary | ICD-10-CM | POA: Diagnosis not present

## 2016-06-01 DIAGNOSIS — E7849 Other hyperlipidemia: Secondary | ICD-10-CM

## 2016-06-01 MED ORDER — ASPIRIN EC 81 MG PO TBEC: 81.0000 mg | DELAYED_RELEASE_TABLET | Freq: Every day | ORAL | 3 refills | Status: AC

## 2016-06-01 NOTE — Patient Instructions (Signed)
Medication Instructions:  1) DECREASE Aspirin 81mg  once daily.  Labwork: None  Testing/Procedures: None  Follow-Up: Your physician wants you to follow-up in: 1 year with Dr. Tamala Julian.  You will receive a reminder letter in the mail two months in advance. If you don't receive a letter, please call our office to schedule the follow-up appointment.   Any Other Special Instructions Will Be Listed Below (If Applicable).     If you need a refill on your cardiac medications before your next appointment, please call your pharmacy.

## 2016-06-11 ENCOUNTER — Telehealth: Payer: Self-pay | Admitting: Interventional Cardiology

## 2016-06-11 DIAGNOSIS — I6522 Occlusion and stenosis of left carotid artery: Secondary | ICD-10-CM

## 2016-06-11 NOTE — Telephone Encounter (Signed)
Left message to call back  

## 2016-06-11 NOTE — Telephone Encounter (Signed)
Spoke with Anderson Malta and advised her per Dr. Thompson Caul note from 06/01/16 pt was advised to continue yearly carotids.  Pt has not had a carotid doppler since 01/2015.  Will route to Dr. Tamala Julian to see if he wants to order a carotid to be done soon? Pt has not been seen by Dr. Fletcher Anon since 2015.

## 2016-06-11 NOTE — Telephone Encounter (Signed)
Follow Up:   Anderson Malta from [facility] ,would like to know if Dr Tamala Julian discuss with pt about having Carotid Doppler on his last office visit on 06-01-16?

## 2016-06-13 NOTE — Telephone Encounter (Signed)
Needs carotid this year.

## 2016-06-16 NOTE — Telephone Encounter (Signed)
Spoke with pt and made him aware that he will need a carotid doppler.  Advised I would place order and scheduler will call him with an appt.  Pt verbalized understanding and was appreciative for call.

## 2016-06-22 ENCOUNTER — Ambulatory Visit (HOSPITAL_COMMUNITY)
Admission: RE | Admit: 2016-06-22 | Discharge: 2016-06-22 | Disposition: A | Discharge status: Home / Self Care | Payer: Medicare Other | Source: Ambulatory Visit | Attending: Cardiology | Admitting: Cardiology

## 2016-06-22 DIAGNOSIS — I6522 Occlusion and stenosis of left carotid artery: Secondary | ICD-10-CM | POA: Insufficient documentation

## 2016-06-26 ENCOUNTER — Ambulatory Visit (INDEPENDENT_AMBULATORY_CARE_PROVIDER_SITE_OTHER): Payer: Medicare Other | Admitting: Diagnostic Neuroimaging

## 2016-06-26 ENCOUNTER — Encounter: Payer: Self-pay | Admitting: Diagnostic Neuroimaging

## 2016-06-26 VITALS — BP 166/86 | HR 89 | Ht 67.0 in | Wt 207.0 lb

## 2016-06-26 DIAGNOSIS — R259 Unspecified abnormal involuntary movements: Secondary | ICD-10-CM | POA: Diagnosis not present

## 2016-06-26 DIAGNOSIS — G20 Parkinson's disease: Secondary | ICD-10-CM | POA: Diagnosis not present

## 2016-06-26 DIAGNOSIS — G252 Other specified forms of tremor: Secondary | ICD-10-CM

## 2016-06-26 DIAGNOSIS — G20C Parkinsonism, unspecified: Secondary | ICD-10-CM

## 2016-06-26 MED ORDER — CARBIDOPA-LEVODOPA 25-100 MG PO TABS: 1.0000 | ORAL_TABLET | Freq: Three times a day (TID) | ORAL | 6 refills | Status: AC

## 2016-06-26 NOTE — Progress Notes (Signed)
[facility]  PATIENT: [name] DOB: [birth year]  REFERRING CLINICIAN: Eldridge Abrahams, NP HISTORY FROM: patient  REASON FOR VISIT: new consult    HISTORICAL  CHIEF COMPLAINT:  Chief Complaint  Patient presents with  . NP Eldridge Abrahams NP    Referral says back pain, pt states sx ? PD  (has R arm tremor, some L)  . Second opinion tremors    ? parkinson's    HISTORY OF PRESENT ILLNESS:   76 year old right-handed male here for evaluation of tremor. History of diabetes, hypercholesterolemia, colon cancer with metastases to liver, lungs, lymph nodes treated in 2012, lobar spine surgery 2.  Patient reports gradual onset of tremor in right upper extremity, mainly at rest. He also has tremor with holding certain objects such as a cup or using a pen to write. Patient notices subtle symptoms in the left hand recently. Symptoms started approximately 2016. He was evaluated by PCP, then referred to neurology Dr. Sabra Heck, who diagnosed patient with possible essential tremor and tried primidone. Symptoms did not improve in spite of increased dose.   Patient has noted gait and balance difficulty, shuffling gait, stooped posture, especially in the last 6 months. No change in smell or taste. He has had intermittent sharp pains in his left arm as well as left foot.  No family history of tremor. Patient was reading about Parkinson's disease and feels like he might have this condition instead of essential tremor, and therefore request a second opinion.   Patient's family are originally from [place], but patient was born and raised in [place]. He grew up performing in circuses (horse riding) until he came to the [place] at age 64 years old. He worked as a Biomedical scientist for many years, now retired. He had a former boss who had Parkinson's disease diagnosis.    REVIEW OF SYSTEMS: Full 14 system review of systems performed and negative with exception of: Fatigue double vision swelling in legs  snoring memory loss numbness tremor depression runny nose urination problems impotence disinterest in activities not asleep depression.  ALLERGIES: Allergies  Allergen Reactions  . Amoxicillin Anaphylaxis, Other (See Comments) and Shortness Of Breath    Stops breathing  . Lipitor [Atorvastatin] Other (See Comments)    Joint and muscle pain "Aches all over, really badly."    HOME MEDICATIONS: Outpatient Medications Prior to Visit  Medication Sig Dispense Refill  . aspirin EC 81 MG tablet Take 1 tablet (81 mg total) by mouth daily. 90 tablet 3  . calcium carbonate (TUMS - DOSED IN MG ELEMENTAL CALCIUM) 500 MG chewable tablet Chew 1 tablet by mouth daily.    Marland Kitchen glyBURIDE (DIABETA) 5 MG tablet Take 5 mg by mouth daily with breakfast.    . insulin glargine (LANTUS) 100 UNIT/ML injection Inject 50 Units into the skin every morning.     . isosorbide mononitrate (IMDUR) 30 MG 24 hr tablet Take 30 mg by mouth daily.    . Multiple Vitamin (MULTIVITAMIN) capsule Take by mouth.    . nitroGLYCERIN (NITROSTAT) 0.4 MG SL tablet Place under the tongue.    Marland Kitchen oxyCODONE-acetaminophen (PERCOCET/ROXICET) 5-325 MG per tablet Take 1-2 tablets by mouth every 6 (six) hours as needed for moderate pain. (Patient taking differently: Take 1-2 tablets by mouth every 8 (eight) hours as needed for moderate pain. ) 30 tablet 0  . primidone (MYSOLINE) 250 MG tablet Take 125 mg by mouth 2 (two) times daily.     . rosuvastatin (CRESTOR) 5  MG tablet Take 5 mg by mouth every morning.    Marland Kitchen acetaminophen (TYLENOL) 650 MG CR tablet Take 650 mg by mouth every morning. And prn    . tadalafil (CIALIS) 5 MG tablet Take by mouth.    . traZODone (DESYREL) 50 MG tablet Take 50 mg by mouth.     No facility-administered medications prior to visit.     PAST MEDICAL HISTORY: Past Medical History:  Diagnosis Date  . Arthritis    "back and left hand  . Carpal tunnel syndrome   . Cervical radiculopathy    left  . Chronic lower back  pain 1960's - present (11/12/2011)  . Chronic neck and back pain   . Coronary artery disease    CARDIOLOGIST--  DR Mallie Mussel SMITH (LOV  10-05-2011)  . Depression   . GERD (gastroesophageal reflux disease)   . H/O colon cancer, stage IV ONCOLOGIST--  DR Marin Olp---  NO RECURRENCE  PER PT   DX 2006  colorectal with mets to liver, lung----  S/P RIGHT COLECTOMY/  LIVER LOBECTOMY FOR METS/  CHEMOTHERAPY (10 CYCLES 10/2008  &  8 CYCLES 10/2009)   and RADIATION THERAPY  . History of angina    chronic angina  . History of DVT of lower extremity    2000--  LEFT LEG  . History of secondary liver cancer    METS FROM COLON CANCER--  S/P LOBECTOMY( 11/2008) AND RADIOFREQUENCY ABLATION FOR 3 LESIONS  (08/2003)  . Hyperlipidemia   . Hypertension   . Lung metastasis (HCC)    STEREOTACTIC BODY RADIOTHERPY  54Gy IN 3 FRACTIONS RIGHT LUNG  COMPLETED 10-26-2009  (TOTAL DOSES  5400 cGy  in three fractions at 1800 cGy per fraction)  . Rotator cuff disorder    right  . S/P CABG x 5    2000  . Type II diabetes mellitus ([place])     PAST SURGICAL HISTORY: Past Surgical History:  Procedure Laterality Date  . CARDIAC CATHETERIZATION  02-08-2007  DR Daneen Schick   60-70% SVG to [facility]  . CARDIOVASCULAR STRESS TEST  09-30-2011  DR Victor STUDY BUT UNCHANGED FROM PRIOR STUDY/  SYMPTOMS ARE STABLE  . CARPAL TUNNEL RELEASE Right 05-29-2008  . CATARACT EXTRACTION W/ INTRAOCULAR LENS  IMPLANT, BILATERAL    . CIRCUMCISION N/A 06/05/2013   Procedure: CIRCUMCISION ADULT;  Surgeon: Claybon Jabs, MD;  Location: [facility];  Service: Urology;  Laterality: N/A;  . CORONARY ARTERY BYPASS GRAFT  2000  DR VAN TRIGHT   5 VESSEL/  LIMA  to LAD/  SVG to OM1/  SVG to  DIAGONAL ONE/  SEQUENTIAL SVG to PDA/  PLOM  . PORTACATH PLACEMENT  last one  2010  . POSTERIOR FUSION LUMBAR SPINE  11/12/2011   L4  -- L5  . REPAIR VENTRAL INSICIONAL HERNIA WITH MESH  03-31-2007  . RIGHT HEMICOLECOTMY/  ELECTIVE  CHOLECYSTECTOMY/  LIVER BX'S  2006  . RIGHT PARTIAL HEPATECOTMY WEDGE RESECTION  11/2008   [facility]  . SHOULDER ARTHROSCOPY/ ACROMIOPLASTY/  ROTATOR CUFF REPAIR/  OPEN DISTAL CLAVICAL  EXCISION Right 06-26-2010    FAMILY HISTORY: Family History  Problem Relation Age of Onset  . Other Mother   . Other Father     SOCIAL HISTORY:  Social History   Social History  . Marital status: Divorced    Spouse name: N/A  . Number of children: N/A  . Years of education: N/A   Occupational History  .  Not on file.   Social History Main Topics  . Smoking status: Never Smoker  . Smokeless tobacco: Never Used  . Alcohol use Yes     Comment: very rare  . Drug use: No  . Sexual activity: Not on file   Other Topics Concern  . Not on file   Social History Narrative   Pt living home alone.  Divorced.   One Kid.  Some college.  Caffeine 12-14 cups daily.  Retired Biomedical scientist.       PHYSICAL EXAM  GENERAL EXAM/CONSTITUTIONAL: Vitals:  Vitals:   06/26/16 0811  BP: (!) 166/86  Pulse: 89  Weight: 207 lb (93.9 kg)  Height: 5\' 7"  (1.702 m)     Body mass index is 32.42 kg/m.  Visual Acuity Screening   Right eye Left eye Both eyes  Without correction: 20/30 20/30   With correction:        Patient is in no distress; well developed, nourished and groomed; neck is supple  CARDIOVASCULAR:  Examination of carotid arteries is normal; RIGHT CAROTID BRUIT  Regular rate and rhythm, no murmurs  Examination of peripheral vascular system by observation and palpation is normal  EYES:  Ophthalmoscopic exam of optic discs and posterior segments is normal; no papilledema or hemorrhages  MUSCULOSKELETAL:  Gait, strength, tone, movements noted in Neurologic exam below  NEUROLOGIC: MENTAL STATUS:  No flowsheet data found.  awake, alert, oriented to person, place and time  recent and remote memory intact  normal attention and concentration  language fluent, comprehension intact, naming  intact,   fund of knowledge appropriate  CRANIAL NERVE:   2nd - no papilledema on fundoscopic exam  2nd, 3rd, 4th, 6th - pupils equal and reactive to light, visual fields full to confrontation, extraocular muscles intact, no nystagmus  5th - facial sensation symmetric  7th - facial strength symmetric  8th - hearing intact  9th - palate elevates symmetrically, uvula midline  11th - shoulder shrug symmetric  12th - tongue protrusion midline  MOTOR:   RESTING TREMOR IN RIGHT HAND >> LEFT HAND  SUBTLE MOUTH TREMOR  MILD COGWHEELING RIGIDITY IN LUE  MILD BRADYKINESIA IN LUE  normal bulk, full strength in the BUE, BLE  SENSORY:   normal and symmetric to light touch, temperature, vibration  DECR IN LEFT HAND BILATERAL FEET  COORDINATION:   finger-nose-finger, fine finger movements SLOW  REFLEXES:   deep tendon reflexes TRACE and symmetric  POSITIVE PALMOMENTAL REFLEXES  GAIT/STATION:   narrow based gait; STOOPED POSTURE; RIGHT HAND TREMOR WITH WALKING; SHUFFLING GAIT; SLOW TURNING    DIAGNOSTIC DATA (LABS, IMAGING, TESTING) - I reviewed patient records, labs, notes, testing and imaging myself where available.  Lab Results  Component Value Date   WBC 4.5 07/30/2014   HGB 14.0 07/30/2014   HCT 42.4 07/30/2014   MCV 86.2 07/30/2014   PLT 151 07/30/2014      Component Value Date/Time   NA 139 07/30/2014 0355   NA 134 10/01/2008 0952   K 4.1 07/30/2014 0355   K 4.9 (H) 10/01/2008 0952   CL 106 07/30/2014 0355   CL 96 (L) 10/01/2008 0952   CO2 28 07/30/2014 0355   CO2 24 10/01/2008 0952   GLUCOSE 77 07/30/2014 0355   GLUCOSE 260 (H) 10/01/2008 0952   BUN 13 07/30/2014 0355   BUN 25 (H) 10/01/2008 0952   CREATININE 0.84 07/30/2014 0355   CREATININE 0.8 10/01/2008 0952   CALCIUM 8.6 (L) 07/30/2014 0355   CALCIUM  9.3 10/01/2008 0952   PROT 6.6 06/16/2010 1100   PROT 6.6 10/01/2008 0952   ALBUMIN 3.5 06/16/2010 1100   ALBUMIN 3.4 10/01/2008 0952     AST 16 06/16/2010 1100   AST 21 10/01/2008 0952   ALT 19 06/16/2010 1100   ALT 22 10/01/2008 0952   ALKPHOS 57 06/16/2010 1100   ALKPHOS 43 10/01/2008 0952   BILITOT 0.7 06/16/2010 1100   BILITOT 0.90 10/01/2008 0952   GFRNONAA >60 07/30/2014 0355   GFRAA >60 07/30/2014 0355   Lab Results  Component Value Date   CHOL 135 07/30/2014   HDL 37 (L) 07/30/2014   LDLCALC 79 07/30/2014   TRIG 95 07/30/2014   CHOLHDL 3.6 07/30/2014   Lab Results  Component Value Date   HGBA1C 8.3 (H) 07/30/2014   No results found for: VITAMINB12 No results found for: TSH  07/29/14 CT head / cervical  - No acute intracranial pathology. No evidence of cervical spine injury.  09/24/11 MRI cervical spine  1.  Moderate cervical spondylosis extending from C4-C5 through C6-C7.  Mild central stenosis associated with disc osteophyte complexes at these levels.  Bilateral foraminal stenosis described above. 2. No metastatic disease.  09/24/11 MRI lumbar spine 1.  L4-L5 grade 1 anterolisthesis secondary to bilateral L4 pars defects.  Anterolisthesis measures 7 mm and the uncoverage of the disc and slip produces severe bilateral foraminal stenosis, greater on the right than left.  2.  Congenitally short pedicles at L3-L4.  Moderate bilateral foraminal stenosis secondary to facet hypertrophy, short pedicles. There is three L5-S1 disc degeneration with no significant stenosis. 3.  Negative for metastatic disease.    ASSESSMENT AND PLAN  76 y.o. year old male here with gradual onset progressive resting tremor, bradykinesia, cogwheel rigidity, shuffling gait, most consistent with idiopathic Parkinson's disease. We'll check MRI to rule out secondary causes. Patient has not had response to primidone for empiric treatment of essential tremor by outside neurologist.    Ddx: parkinsonism (idiopathic PD, vascular parkinsonism, CNS inflamm, infection, vascular)  1. Parkinsonism, unspecified Parkinsonism type ([place])    2. Resting tremor   3. Mixed action and resting tremor   4. Postural tremor      PLAN:  - decrease primidone to 125mg  daily x 1 week, then stop  - start carbidopa/levodopa (25/100) half tab three times per day x 1-2 weeks; then increase to 1 tab three times per day; take with meals  - check MRI brain   - refer to PT for parkinson's gait training and education   Orders Placed This Encounter  Procedures  . MR BRAIN W WO CONTRAST  . Ambulatory referral to Physical Therapy    Meds ordered this encounter  Medications  . carbidopa-levodopa (SINEMET IR) 25-100 MG tablet    Sig: Take 1 tablet by mouth 3 (three) times daily before meals.    Dispense:  90 tablet    Refill:  6    Return in about 2 months (around 08/26/2016).    Penni Bombard, MD 09/09/348, 0:93 AM Certified in Neurology, Neurophysiology and Neuroimaging  [facility] [address] [place], [place] [address] [phone]

## 2016-06-26 NOTE — Patient Instructions (Signed)
Thank you for coming to see [place] at [facility]. I hope we have been able to provide you high quality care today.  You may receive a patient satisfaction survey over the next few weeks. We would appreciate your feedback and comments so that we may continue to improve ourselves and the health of our patients.   - decrease primidone to 133m daily x 1 week, then stop  - start carbidopa/levodopa (25/100) half tab three times per day x 1-2 weeks; then increase to 1 tab three times per day; take with meals  - check MRI brain   - refer to PT for parkinson's gait training and education    ~~~~~~~~~~~~~~~~~~~~~~~~~~~~~~~~~~~~~~~~~~~~~~~~~~~~~~~~~~~~~~~~~  DR. PENUMALLI'S GUIDE TO HAPPY AND HEALTHY LIVING These are some of my general health and wellness recommendations. Some of them may apply to you better than others. Please use common sense as you try these suggestions and feel free to ask me any questions.   ACTIVITY/FITNESS Mental, social, emotional and physical stimulation are very important for brain and body health. Try learning a new activity (arts, music, language, sports, games).  Keep moving your body to the best of your abilities. You can do this at home, inside or outside, the park, community center, gym or anywhere you like. Consider a physical therapist or personal trainer to get started. Consider the app Sworkit. Fitness trackers such as smart-watches, smart-phones or Fitbits can help as well.   NUTRITION Eat more plants: colorful vegetables, nuts, seeds and berries.  Eat less sugar, salt, preservatives and processed foods.  Avoid toxins such as cigarettes and alcohol.  Drink water when you are thirsty. Warm water with a slice of lemon is an excellent morning drink to start the day.  Consider these websites for more information The Nutrition Source (hhttps://www.henry-hernandez.biz/ Precision Nutrition  (wWindowBlog.ch   RELAXATION Consider practicing mindfulness meditation or other relaxation techniques such as deep breathing, prayer, yoga, tai chi, massage. See website mindful.org or the apps Headspace or Calm to help get started.   SLEEP Try to get at least 7-8+ hours sleep per day. Regular exercise and reduced caffeine will help you sleep better. Practice good sleep hygeine techniques. See website sleep.org for more information.   PLANNING Prepare estate planning, living will, healthcare POA documents. Sometimes this is best planned with the help of an attorney. Theconversationproject.org and agingwithdignity.org are excellent resources.

## 2016-07-06 ENCOUNTER — Telehealth: Payer: Self-pay | Admitting: Interventional Cardiology

## 2016-07-06 NOTE — Telephone Encounter (Signed)
Request for surgical clearance:   Nees this  Answer asap(urgent) 1. What type of surgery is being performed? Epidural Steroid Injection   2. When is this surgery scheduled? 07-16-16   3. Are there any medications that need to be held prior to surgery and how long?He wants to know from a Cardiologist stand point can pt handle a large dose of steroid also is pt on any blood thinner?   4. Name of physician performing surgery?Dr Lynden Oxford     5. What is your office phone and fax number? [phone] and fax is [phone]

## 2016-07-06 NOTE — Telephone Encounter (Signed)
Will route to Dr. Tamala Julian for review and advisement.  Pt is on ASA.

## 2016-07-07 NOTE — Telephone Encounter (Signed)
He is able to tolerate the procedure. He is only on aspirin. It would be okay to hold this if needed.

## 2016-07-07 NOTE — Telephone Encounter (Signed)
Faxed to requesting office. 

## 2016-07-10 ENCOUNTER — Ambulatory Visit
Admission: RE | Admit: 2016-07-10 | Discharge: 2016-07-10 | Disposition: A | Discharge status: Home / Self Care | Payer: Medicare Other | Source: Ambulatory Visit | Attending: Diagnostic Neuroimaging | Admitting: Diagnostic Neuroimaging

## 2016-07-10 DIAGNOSIS — R259 Unspecified abnormal involuntary movements: Secondary | ICD-10-CM

## 2016-07-10 DIAGNOSIS — G252 Other specified forms of tremor: Secondary | ICD-10-CM

## 2016-07-10 DIAGNOSIS — G20 Parkinson's disease: Secondary | ICD-10-CM

## 2016-07-10 MED ORDER — GADOBENATE DIMEGLUMINE 529 MG/ML IV SOLN
10.0000 mL | Freq: Once | INTRAVENOUS | Status: AC | PRN
  Administered 2016-07-10: 10 mL via INTRAVENOUS

## 2016-07-14 ENCOUNTER — Telehealth: Payer: Self-pay | Admitting: *Deleted

## 2016-07-14 NOTE — Telephone Encounter (Signed)
Per Dr Leta Baptist, spoke with patient and informed him that his MRI brain showed some mild spots of scar tissue, no major findings and overall unremarkable findings. Advised he continue with plan. Patient begins PT tomorrow. He verbalized understanding, appreciation of call.

## 2016-07-15 ENCOUNTER — Ambulatory Visit: Payer: Medicare Other | Attending: Diagnostic Neuroimaging | Admitting: Physical Therapy

## 2016-07-15 DIAGNOSIS — R29818 Other symptoms and signs involving the nervous system: Secondary | ICD-10-CM | POA: Insufficient documentation

## 2016-07-15 DIAGNOSIS — R2689 Other abnormalities of gait and mobility: Secondary | ICD-10-CM

## 2016-07-15 DIAGNOSIS — R293 Abnormal posture: Secondary | ICD-10-CM | POA: Insufficient documentation

## 2016-07-15 DIAGNOSIS — R2681 Unsteadiness on feet: Secondary | ICD-10-CM | POA: Diagnosis present

## 2016-07-16 NOTE — Therapy (Signed)
[facility] [address] [place], [place], [address] Phone: [phone]   Fax:  [phone]  Physical Therapy Evaluation  Patient Details  Name: [name] MRN: [id number] Date of Birth: 06/05/1940 Referring Provider: Leta Baptist  Encounter Date: 07/15/2016      PT End of Session - 07/16/16 1017    Visit Number 1   Number of Visits 17   Date for PT Re-Evaluation 09/14/16   Authorization Type [employer] Medicare-GCODE every 10th visit   PT Start Time 0935   PT Stop Time 1018   PT Time Calculation (min) 43 min   Equipment Utilized During Treatment Gait belt   Activity Tolerance Patient tolerated treatment well   Behavior During Therapy [facility] for tasks assessed/performed      Past Medical History:  Diagnosis Date  . Arthritis    "back and left hand  . Carpal tunnel syndrome   . Cervical radiculopathy    left  . Chronic lower back pain 1960's - present (11/12/2011)  . Chronic neck and back pain   . Coronary artery disease    CARDIOLOGIST--  DR Mallie Mussel SMITH (LOV  10-05-2011)  . Depression   . GERD (gastroesophageal reflux disease)   . H/O colon cancer, stage IV ONCOLOGIST--  DR Marin Olp---  NO RECURRENCE  PER PT   DX 2006  colorectal with mets to liver, lung----  S/P RIGHT COLECTOMY/  LIVER LOBECTOMY FOR METS/  CHEMOTHERAPY (10 CYCLES 10/2008  &  8 CYCLES 10/2009)   and RADIATION THERAPY  . History of angina    chronic angina  . History of DVT of lower extremity    2000--  LEFT LEG  . History of secondary liver cancer    METS FROM COLON CANCER--  S/P LOBECTOMY( 11/2008) AND RADIOFREQUENCY ABLATION FOR 3 LESIONS  (08/2003)  . Hyperlipidemia   . Hypertension   . Lung metastasis (HCC)    STEREOTACTIC BODY RADIOTHERPY  54Gy IN 3 FRACTIONS RIGHT LUNG  COMPLETED 10-26-2009  (TOTAL DOSES  5400 cGy  in three fractions at 1800 cGy per fraction)  . Rotator cuff disorder    right  . S/P CABG x 5    2000  . Type II diabetes  mellitus ([place])     Past Surgical History:  Procedure Laterality Date  . CARDIAC CATHETERIZATION  02-08-2007  DR Daneen Schick   60-70% SVG to [facility]  . CARDIOVASCULAR STRESS TEST  09-30-2011  DR [place] STUDY BUT UNCHANGED FROM PRIOR STUDY/  SYMPTOMS ARE STABLE  . CARPAL TUNNEL RELEASE Right 05-29-2008  . CATARACT EXTRACTION W/ INTRAOCULAR LENS  IMPLANT, BILATERAL    . CIRCUMCISION N/A 06/05/2013   Procedure: CIRCUMCISION ADULT;  Surgeon: Claybon Jabs, MD;  Location: [facility];  Service: Urology;  Laterality: N/A;  . CORONARY ARTERY BYPASS GRAFT  2000  DR VAN TRIGHT   5 VESSEL/  LIMA  to LAD/  SVG to OM1/  SVG to  DIAGONAL ONE/  SEQUENTIAL SVG to PDA/  PLOM  . PORTACATH PLACEMENT  last one  2010  . POSTERIOR FUSION LUMBAR SPINE  11/12/2011   L4  -- L5  . REPAIR VENTRAL INSICIONAL HERNIA WITH MESH  03-31-2007  . RIGHT HEMICOLECOTMY/  ELECTIVE CHOLECYSTECTOMY/  LIVER BX'S  2006  . RIGHT PARTIAL HEPATECOTMY WEDGE RESECTION  11/2008   [facility]  . SHOULDER ARTHROSCOPY/ ACROMIOPLASTY/  ROTATOR CUFF REPAIR/  OPEN DISTAL CLAVICAL  EXCISION Right 06-26-2010  There were no vitals filed for this visit.       Subjective Assessment - 07/15/16 0941    Subjective Pt is a 76 year old male with new diagnosis of Parkinson's disease; history of spinal issues, to have injection for back pain tomorrow.  Wonders if back pain is limiting walking.  Pt reports having to focus and concentrate on steps more than usual.  Notices R hand tremor since 2 years ago.   Pertinent History DM, history of CABG, angina, DVT; history of 2 ruptured disks in low back-surgery to repair; now spondylolisthesis with bulging disc   Patient Stated Goals Pt's goal for therapy is to help with balance.  Wants to see if shot (for low back) helps with balance.   Currently in Pain? Yes   Pain Score 4    Pain Location Back   Pain Orientation Lower   Pain Descriptors / Indicators Sharp   Pain Type  Chronic pain   Pain Radiating Towards R hip   Pain Onset More than a month ago   Pain Frequency Constant   Aggravating Factors  standing and walking aggravates pain   Pain Relieving Factors lying down on stomach in bed   Effect of Pain on Daily Activities PT will monitor, but will not address as a goal, as pain is longstanding and is being addressed by physician.            [facility] PT Assessment - 07/15/16 6606      Assessment   Medical Diagnosis Parkinson's disease   Referring Provider Penumalli   Onset Date/Surgical Date 06/26/16  MD visit     Precautions   Precautions Fall     Balance Screen   Has the patient fallen in the past 6 months No   Has the patient had a decrease in activity level because of a fear of falling?  No   Is the patient reluctant to leave their home because of a fear of falling?  No     Home Environment   Living Environment Private residence   Living Arrangements Alone   Type of [place] to enter   Entrance Stairs-Number of Steps 2   Entrance Stairs-Rails None   Home Layout Two level;Bed/bath upstairs   Alternate Level Stairs-Number of Steps --  flight   Alternate Level Stairs-Rails Right   Home Equipment [place] - single point  walking stick   Additional Comments Reports definite use of railing with stair negotiation (recently has had to do this due to fear of falling on steps     Prior Function   Level of Independence Independent with basic ADLs;Independent with community mobility without device   Vocation Retired  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   Leisure Walks his dog in the yard     ROM / Strength   AROM / PROM / Strength AROM;PROM;Strength     PROM   Overall PROM Comments Mild increase in tone noted with P/ROM in RLE     Strength   Overall Strength Comments Grossly tested 4/5 bilateral lower extremities, with R quads 4/5; ankle dorsiflexion 3/5     Transfers   Transfers Sit to Stand;Stand to Sit   Sit to  Stand 6: Modified independent (Device/Increase time);Without upper extremity assist;From chair/3-in-1   Five time sit to stand comments  10.88   Stand to Sit 6: Modified independent (Device/Increase time);Without upper extremity assist;To chair/3-in-1   Comments Reports difficulty with low surface transfers  Ambulation/Gait   Ambulation/Gait Yes   Ambulation/Gait Assistance 6: Modified independent (Device/Increase time)   Ambulation Distance (Feet) 200 Feet   Assistive device None   Gait Pattern Step-through pattern;Decreased arm swing - right;Decreased step length - right;Decreased dorsiflexion - right;Decreased trunk rotation;Trunk flexed;Narrow base of support   Ambulation Surface Level;Indoor   Gait velocity 12.78 sec = 2.57 ft/sec   Stairs Yes   Stairs Assistance 6: Modified independent (Device/Increase time)   Stair Management Technique One rail Right;Alternating pattern   Number of Stairs 4   Height of Stairs 6   Gait Comments Reports noting some times of hesitation with feet moving (?festination/freezing)     Standardized Balance Assessment   Standardized Balance Assessment Timed Up and Go Test     Timed Up and Go Test   Normal TUG (seconds) 16.35   Manual TUG (seconds) 15.97   Cognitive TUG (seconds) 14.88   TUG Comments Scores >13.5-15 seconds indicates increased fall risk     Functional Gait  Assessment   Gait assessed  Yes   Gait Level Surface Walks 20 ft, slow speed, abnormal gait pattern, evidence for imbalance or deviates 10-15 in outside of the 12 in walkway width. Requires more than 7 sec to ambulate 20 ft.  8.40 sec   Change in Gait Speed Makes only minor adjustments to walking speed, or accomplishes a change in speed with significant gait deviations, deviates 10-15 in outside the 12 in walkway width, or changes speed but loses balance but is able to recover and continue walking.   Gait with Horizontal Head Turns Performs head turns with moderate changes in gait  velocity, slows down, deviates 10-15 in outside 12 in walkway width but recovers, can continue to walk.   Gait with Vertical Head Turns Performs task with moderate change in gait velocity, slows down, deviates 10-15 in outside 12 in walkway width but recovers, can continue to walk.   Gait and Pivot Turn Pivot turns safely in greater than 3 sec and stops with no loss of balance, or pivot turns safely within 3 sec and stops with mild imbalance, requires small steps to catch balance.  3.44   Step Over Obstacle Is able to step over one shoe box (4.5 in total height) but must slow down and adjust steps to clear box safely. May require verbal cueing.   Gait with Narrow Base of Support Ambulates 4-7 steps.   Gait with Eyes Closed Walks 20 ft, slow speed, abnormal gait pattern, evidence for imbalance, deviates 10-15 in outside 12 in walkway width. Requires more than 9 sec to ambulate 20 ft.  14.44 sec veers to R   Ambulating Backwards Walks 20 ft, slow speed, abnormal gait pattern, evidence for imbalance, deviates 10-15 in outside 12 in walkway width.  22.35 sec in 20 ft   Steps Alternating feet, must use rail.   Total Score 12   FGA comment: Scores <22/30 indicate increased fall risk.                             PT Short Term Goals - 07/16/16 0836      PT SHORT TERM GOAL #1   Title Pt will be independent with Parkinson's specific HEP to address posture, balance, gait.  TARGET 08/14/16   Time 4   Period Weeks   Status New     PT SHORT TERM GOAL #2   Title Pt will improve TUG score to less  than or equal to 13.5 seconds for decreased fall risk.   Time 4   Period Weeks   Status New     PT SHORT TERM GOAL #3   Title Pt will improve Functional Gait Assessment to at least 15/30 for decreased fall risk.   Time 4   Period Weeks   Status New     PT SHORT TERM GOAL #4   Title Pt will verbalize understanding of local Parkinson's disease-related resources.   Time 4   Period  Weeks   Status New           PT Long Term Goals - 07/16/16 0240      PT LONG TERM GOAL #1   Title Pt will verbalize understanding of fall prevention, including tips to reduce freezing/festinating with gait.  TARGET 09/14/16   Time 8   Period Weeks   Status New     PT LONG TERM GOAL #2   Title Pt will improve TUG manual score to less than or equal to 14.5 seconds for decreased fall risk.   Time 8   Period Weeks   Status New     PT LONG TERM GOAL #3   Title Pt will improve FGA score to at least 20/30 for decreased fall risk.   Time 8   Period Weeks   Status New     PT LONG TERM GOAL #4   Title Pt will improve gait velocity to at least 2.8 ft/sec for improved gait efficiency and safety.   Time 8   Period Weeks   Status New     PT LONG TERM GOAL #5   Title Pt will verbalize plans for continued community fitness upon D/C from PT.   Time 8   Period Weeks   Status New               Plan - 07/16/16 9735    Clinical Impression Statement Pt is a 76 year old male who presents to OP PT with recent diagnosis of Parkinson's disease.  Pt has at least 3 co-morbidities, as indicated in [facility] PMH.  Pt's presentation is moderate complexity, with evolving presentation, as newly dx with PD and placed on initial doses of carbi-dopa/Levo-dopa.  Pt presents with rigidity, bradykinesia, decreased balance, decreased timing and coordination of gait, abnormal posture, decreased gait velocity, fall risk per TUG scores and FGA.  Pt has not had any falls reported.  Pt would benefit from skilled physical therapy to address the above deficits for improved functional mobility and decreased fall risk.   Rehab Potential Good   Clinical Impairments Affecting Rehab Potential History of low back pain due to disc involvement-being followed by physician and to have injection 07/16/16   PT Frequency 2x / week   PT Duration 8 weeks  plus eval   PT Treatment/Interventions ADLs/Self Care Home  Management;Functional mobility training;Stair training;Gait training;Therapeutic activities;Therapeutic exercise;Balance training;Neuromuscular re-education;Patient/family education   PT Next Visit Plan Initiate HEP for PWR! Moves in sitting and standing; gait training for improved intensity   Consulted and Agree with Plan of Care Patient      Patient will benefit from skilled therapeutic intervention in order to improve the following deficits and impairments:  Abnormal gait, Decreased balance, Decreased mobility, Decreased coordination, Decreased strength, Difficulty walking, Impaired tone, Postural dysfunction  Visit Diagnosis: Other abnormalities of gait and mobility  Abnormal posture  Unsteadiness on feet  Other symptoms and signs involving the nervous system      G-Codes -  07/15/16 3568    Functional Assessment Tool Used (Outpatient Only) TUG 16.35 sec, TUG man 15.97 sec, gait velocity 2.57 ft/sec, FGA 12/30   Functional Limitation Mobility: Walking and moving around   Mobility: Walking and Moving Around Current Status [phone]) At least 20 percent but less than 40 percent impaired, limited or restricted   Mobility: Walking and Moving Around Goal Status [phone]) At least 1 percent but less than 20 percent impaired, limited or restricted       Problem List Patient Active Problem List   Diagnosis Date Noted  . Coronary artery disease involving coronary bypass graft of native heart with angina pectoris ([place]) 07/11/2013  . Essential hypertension 07/11/2013  . Hyperlipidemia 07/11/2013  . Type 2 diabetes mellitus with complication ([place]) 01/29/5207  . Left carotid stenosis 07/11/2013    Zhaniya Swallows W. 07/16/2016, 8:44 AM Frazier Butt., PT [facility] [address] [place], [place], [address] Phone: [phone]   Fax:  [phone]  Name: [name] MRN: [id number] Date of Birth: [birth year]

## 2016-07-22 ENCOUNTER — Ambulatory Visit: Payer: Medicare Other | Admitting: Physical Therapy

## 2016-07-22 ENCOUNTER — Encounter: Payer: Self-pay | Admitting: Physical Therapy

## 2016-07-22 DIAGNOSIS — R2689 Other abnormalities of gait and mobility: Secondary | ICD-10-CM | POA: Diagnosis not present

## 2016-07-22 DIAGNOSIS — R2681 Unsteadiness on feet: Secondary | ICD-10-CM

## 2016-07-22 DIAGNOSIS — R293 Abnormal posture: Secondary | ICD-10-CM

## 2016-07-22 DIAGNOSIS — R29818 Other symptoms and signs involving the nervous system: Secondary | ICD-10-CM

## 2016-07-22 NOTE — Patient Instructions (Addendum)
Walking Program:  Begin walking for exercise for 5 minutes, 1 times/day, most days/week.   Progress your walking program by adding 1-2 minutes to your routine each week, as tolerated. Be sure to wear good walking shoes, walk in a safe environment and only progress to your tolerance.     Reminders for Walking technique:  Initially put heel down first and then roll off the balls of your feet Work on visually scanning

## 2016-07-22 NOTE — Therapy (Signed)
[facility] [address] [place], [place], [address] Phone: [phone]   Fax:  [phone]  Physical Therapy Treatment  Patient Details  Name: [name] MRN: [id number] Date of Birth: 18-Sep-1940 Referring Provider: Leta Baptist  Encounter Date: 07/22/2016      PT End of Session - 07/22/16 1142    Visit Number 2   Number of Visits 17   Date for PT Re-Evaluation 09/14/16   Authorization Type [employer] Medicare-GCODE every 10th visit   PT Start Time 1058   PT Stop Time 1143   PT Time Calculation (min) 45 min      Past Medical History:  Diagnosis Date  . Arthritis    "back and left hand  . Carpal tunnel syndrome   . Cervical radiculopathy    left  . Chronic lower back pain 1960's - present (11/12/2011)  . Chronic neck and back pain   . Coronary artery disease    CARDIOLOGIST--  DR Mallie Mussel SMITH (LOV  10-05-2011)  . Depression   . GERD (gastroesophageal reflux disease)   . H/O colon cancer, stage IV ONCOLOGIST--  DR Marin Olp---  NO RECURRENCE  PER PT   DX 2006  colorectal with mets to liver, lung----  S/P RIGHT COLECTOMY/  LIVER LOBECTOMY FOR METS/  CHEMOTHERAPY (10 CYCLES 10/2008  &  8 CYCLES 10/2009)   and RADIATION THERAPY  . History of angina    chronic angina  . History of DVT of lower extremity    2000--  LEFT LEG  . History of secondary liver cancer    METS FROM COLON CANCER--  S/P LOBECTOMY( 11/2008) AND RADIOFREQUENCY ABLATION FOR 3 LESIONS  (08/2003)  . Hyperlipidemia   . Hypertension   . Lung metastasis (HCC)    STEREOTACTIC BODY RADIOTHERPY  54Gy IN 3 FRACTIONS RIGHT LUNG  COMPLETED 10-26-2009  (TOTAL DOSES  5400 cGy  in three fractions at 1800 cGy per fraction)  . Rotator cuff disorder    right  . S/P CABG x 5    2000  . Type II diabetes mellitus ([place])     Past Surgical History:  Procedure Laterality Date  . CARDIAC CATHETERIZATION  02-08-2007  DR Daneen Schick   60-70% SVG to [facility]  .  CARDIOVASCULAR STRESS TEST  09-30-2011  DR Jan [place] STUDY BUT UNCHANGED FROM PRIOR STUDY/  SYMPTOMS ARE STABLE  . CARPAL TUNNEL RELEASE Right 05-29-2008  . CATARACT EXTRACTION W/ INTRAOCULAR LENS  IMPLANT, BILATERAL    . CIRCUMCISION N/A 06/05/2013   Procedure: CIRCUMCISION ADULT;  Surgeon: Claybon Jabs, MD;  Location: [facility];  Service: Urology;  Laterality: N/A;  . CORONARY ARTERY BYPASS GRAFT  2000  DR VAN TRIGHT   5 VESSEL/  LIMA  to LAD/  SVG to OM1/  SVG to  DIAGONAL ONE/  SEQUENTIAL SVG to PDA/  PLOM  . PORTACATH PLACEMENT  last one  2010  . POSTERIOR FUSION LUMBAR SPINE  11/12/2011   L4  -- L5  . REPAIR VENTRAL INSICIONAL HERNIA WITH MESH  03-31-2007  . RIGHT HEMICOLECOTMY/  ELECTIVE CHOLECYSTECTOMY/  LIVER BX'S  2006  . RIGHT PARTIAL HEPATECOTMY WEDGE RESECTION  11/2008   [facility]  . SHOULDER ARTHROSCOPY/ ACROMIOPLASTY/  ROTATOR CUFF REPAIR/  OPEN DISTAL CLAVICAL  EXCISION Right 06-26-2010    There were no vitals filed for this visit.      Subjective Assessment - 07/22/16 1058    Subjective Injection for back  seems to be helping. Back is stiff but not painful. Tremors make it very hard for pt to write.   Pertinent History DM, history of CABG, angina, DVT; history of 2 ruptured disks in low back-surgery to repair; now spondylolisthesis with bulging disc   Limitations Sitting   Patient Stated Goals Pt's goal for therapy is to help with balance.  Wants to see if shot (for low back) helps with balance.   Currently in Pain? No/denies   Pain Onset More than a month ago                         [facility] Adult PT Treatment/Exercise - 07/22/16 0001      Ambulation/Gait   Ambulation/Gait Yes   Ambulation/Gait Assistance 5: Supervision   Ambulation/Gait Assistance Details training: gait mechanics working on heel to gait training and visual scanning   Ambulation Distance (Feet) 600 Feet   Assistive device None   Gait Pattern  Step-through pattern;Decreased arm swing - right;Decreased step length - right;Decreased dorsiflexion - right;Decreased trunk rotation;Trunk flexed;Narrow base of support   Ambulation Surface Level;Indoor           [facility]) - 07/22/16 1230 SEATED   PWR! Up x10   PWR! Rock x10   PWR! Twist x10   PWR! Step x10   Comments cues for posture and technique.             PT Education - 07/22/16 1232    Education provided Yes   Education Details Walking program and Seated PWR! Moves. Education on goals and importance of continuing fitness plan.   Person(s) Educated Patient   Methods Explanation;Demonstration;Verbal cues;Handout   Comprehension Verbalized understanding;Returned demonstration;Need further instruction;Verbal cues required          PT Short Term Goals - 07/16/16 0836      PT SHORT TERM GOAL #1   Title Pt will be independent with Parkinson's specific HEP to address posture, balance, gait.  TARGET 08/14/16   Time 4   Period Weeks   Status New     PT SHORT TERM GOAL #2   Title Pt will improve TUG score to less than or equal to 13.5 seconds for decreased fall risk.   Time 4   Period Weeks   Status New     PT SHORT TERM GOAL #3   Title Pt will improve Functional Gait Assessment to at least 15/30 for decreased fall risk.   Time 4   Period Weeks   Status New     PT SHORT TERM GOAL #4   Title Pt will verbalize understanding of local Parkinson's disease-related resources.   Time 4   Period Weeks   Status New           PT Long Term Goals - 07/16/16 7169      PT LONG TERM GOAL #1   Title Pt will verbalize understanding of fall prevention, including tips to reduce freezing/festinating with gait.  TARGET 09/14/16   Time 8   Period Weeks   Status New     PT LONG TERM GOAL #2   Title Pt will improve TUG manual score to less than or equal to 14.5 seconds for decreased fall risk.   Time 8   Period Weeks   Status New     PT LONG TERM GOAL #3   Title Pt  will improve FGA score to at least 20/30 for decreased fall risk.   Time 8  Period Weeks   Status New     PT LONG TERM GOAL #4   Title Pt will improve gait velocity to at least 2.8 ft/sec for improved gait efficiency and safety.   Time 8   Period Weeks   Status New     PT LONG TERM GOAL #5   Title Pt will verbalize plans for continued community fitness upon D/C from PT.   Time 8   Period Weeks   Status New               Plan - 07/22/16 1233    Clinical Impression Statement Initiated wakling program and Seated PWR! Moves; pt required cues for mechanics and technique with each.   Rehab Potential Good   Clinical Impairments Affecting Rehab Potential History of low back pain due to disc involvement-being followed by physician and to have injection 07/16/16   PT Frequency 2x / week   PT Duration 8 weeks  plus eval   PT Treatment/Interventions ADLs/Self Care Home Management;Functional mobility training;Stair training;Gait training;Therapeutic activities;Therapeutic exercise;Balance training;Neuromuscular re-education;Patient/family education   PT Next Visit Plan Review HEP for PWR! Moves in sitting and standing; gait training for improved intensity   Consulted and Agree with Plan of Care Patient      Patient will benefit from skilled therapeutic intervention in order to improve the following deficits and impairments:  Abnormal gait, Decreased balance, Decreased mobility, Decreased coordination, Decreased strength, Difficulty walking, Impaired tone, Postural dysfunction  Visit Diagnosis: Other abnormalities of gait and mobility  Abnormal posture  Unsteadiness on feet  Other symptoms and signs involving the nervous system     Problem List Patient Active Problem List   Diagnosis Date Noted  . Coronary artery disease involving coronary bypass graft of native heart with angina pectoris ([place]) 07/11/2013  . Essential hypertension 07/11/2013  . Hyperlipidemia 07/11/2013  .  Type 2 diabetes mellitus with complication ([place]) 24/11/7351  . Left carotid stenosis 07/11/2013    Bjorn Loser, PTA  07/22/16, 12:40 PM [facility] [address] [place], [place], [address] Phone: [phone]   Fax:  [phone]  Name: JOAS MOTTON MRN: [id number] Date of Birth: 02/28/1941

## 2016-07-24 ENCOUNTER — Ambulatory Visit: Payer: Medicare Other | Admitting: Physical Therapy

## 2016-07-28 ENCOUNTER — Ambulatory Visit: Payer: Medicare Other | Admitting: Physical Therapy

## 2016-07-30 ENCOUNTER — Ambulatory Visit: Payer: Medicare Other | Admitting: Physical Therapy

## 2016-08-04 ENCOUNTER — Ambulatory Visit: Payer: Medicare Other | Admitting: Physical Therapy

## 2016-08-05 ENCOUNTER — Encounter: Payer: Self-pay | Admitting: Physical Therapy

## 2016-08-05 DIAGNOSIS — R2689 Other abnormalities of gait and mobility: Secondary | ICD-10-CM | POA: Diagnosis not present

## 2016-08-05 NOTE — Therapy (Signed)
[facility] [address] [place], [place], [address] Phone: [phone]   Fax:  [phone]  Patient Details  Name: [name] MRN: [id number] Date of Birth: 1940/12/29 Referring Provider:  No ref. provider found  Encounter Date: 2016-08-11   PHYSICAL THERAPY DISCHARGE SUMMARY  Visits from Start of Care: 2  Current functional level related to goals / functional outcomes: N/A-See eval for details   Remaining deficits: N/A   Education / Equipment: Initiated HEP on first visit.        G-Codes - 2016/08/11 0946    Functional Assessment Tool Used (Outpatient Only) TUG 16.35 sec, TUG man 15.97 sec, gait velocity 2.57 ft/sec, FGA 12/30 (as per eval)   Functional Limitation Mobility: Walking and moving around   Mobility: Walking and Moving Around Goal Status [phone]) At least 1 percent but less than 20 percent impaired, limited or restricted   Mobility: Walking and Moving Around Discharge Status [phone]) At least 20 percent but less than 40 percent impaired, limited or restricted       Plan: Patient agrees to discharge.  Patient goals were not met. Patient is being discharged due to not returning since the last visit.  ?????Pt did not return after second PT visit.  Received phone call from daughter on 08/10/16 that pt is deceased.  Frazier Butt., PT     Mady Haagensen W. Aug 11, 2016, 9:47 AM  [facility] [address] [place], [place], [address] Phone: [phone]   Fax:  [phone]

## 2016-08-06 ENCOUNTER — Ambulatory Visit: Payer: Medicare Other | Admitting: Physical Therapy

## 2016-08-11 ENCOUNTER — Ambulatory Visit: Payer: Medicare Other | Admitting: Physical Therapy

## 2016-08-13 ENCOUNTER — Ambulatory Visit: Payer: Medicare Other | Admitting: Physical Therapy

## 2016-08-14 DEATH — deceased

## 2016-08-26 ENCOUNTER — Ambulatory Visit: Payer: Medicare Other | Admitting: Diagnostic Neuroimaging

## 2016-08-27 ENCOUNTER — Encounter: Payer: Self-pay | Admitting: Diagnostic Neuroimaging

## 2016-09-04 ENCOUNTER — Ambulatory Visit: Payer: Medicare Other | Admitting: Diagnostic Neuroimaging

## 2017-01-07 ENCOUNTER — Other Ambulatory Visit: Payer: Self-pay | Admitting: *Deleted

## 2017-01-07 DIAGNOSIS — I6523 Occlusion and stenosis of bilateral carotid arteries: Secondary | ICD-10-CM
